# Patient Record
Sex: Female | Born: 1960 | Race: White | Hispanic: No | Marital: Married | Smoking: Never smoker
Health system: Southern US, Community
[De-identification: ages and names within clinical notes are randomized; demographics above are authoritative.]

## PROBLEM LIST (undated history)

## (undated) DIAGNOSIS — E785 Hyperlipidemia, unspecified: Secondary | ICD-10-CM

## (undated) DIAGNOSIS — F329 Major depressive disorder, single episode, unspecified: Secondary | ICD-10-CM

## (undated) DIAGNOSIS — F419 Anxiety disorder, unspecified: Secondary | ICD-10-CM

## (undated) DIAGNOSIS — F32A Depression, unspecified: Secondary | ICD-10-CM

## (undated) DIAGNOSIS — K219 Gastro-esophageal reflux disease without esophagitis: Secondary | ICD-10-CM

## (undated) DIAGNOSIS — G43909 Migraine, unspecified, not intractable, without status migrainosus: Secondary | ICD-10-CM

## (undated) DIAGNOSIS — Z8719 Personal history of other diseases of the digestive system: Secondary | ICD-10-CM

## (undated) DIAGNOSIS — H9190 Unspecified hearing loss, unspecified ear: Secondary | ICD-10-CM

## (undated) DIAGNOSIS — K8044 Calculus of bile duct with chronic cholecystitis without obstruction: Secondary | ICD-10-CM

## (undated) HISTORY — PX: NASAL SEPTUM SURGERY: SHX37

## (undated) HISTORY — PX: TONSILLECTOMY: SHX5217

## (undated) HISTORY — DX: Migraine, unspecified, not intractable, without status migrainosus: G43.909

## (undated) HISTORY — DX: Hyperlipidemia, unspecified: E78.5

## (undated) HISTORY — DX: Major depressive disorder, single episode, unspecified: F32.9

## (undated) HISTORY — PX: ABDOMINAL HYSTERECTOMY: SHX81

## (undated) HISTORY — PX: TUBAL LIGATION: SHX77

## (undated) HISTORY — DX: Depression, unspecified: F32.A

## (undated) HISTORY — DX: Anxiety disorder, unspecified: F41.9

## (undated) HISTORY — PX: CYSTECTOMY: SUR359

---

## 1999-01-06 ENCOUNTER — Encounter: Payer: Self-pay | Admitting: Orthopedic Surgery

## 1999-01-06 ENCOUNTER — Encounter: Admission: RE | Admit: 1999-01-06 | Discharge: 1999-01-06 | Payer: Self-pay | Admitting: Orthopedic Surgery

## 2007-05-20 ENCOUNTER — Other Ambulatory Visit: Admission: RE | Admit: 2007-05-20 | Discharge: 2007-05-20 | Payer: Self-pay | Admitting: Family Medicine

## 2009-02-21 ENCOUNTER — Other Ambulatory Visit: Admission: RE | Admit: 2009-02-21 | Discharge: 2009-02-21 | Payer: Self-pay | Admitting: Family Medicine

## 2009-10-02 ENCOUNTER — Encounter: Admission: RE | Admit: 2009-10-02 | Discharge: 2009-10-02 | Payer: Self-pay | Admitting: Family Medicine

## 2010-02-09 ENCOUNTER — Encounter: Payer: Self-pay | Admitting: Family Medicine

## 2011-08-11 ENCOUNTER — Telehealth (INDEPENDENT_AMBULATORY_CARE_PROVIDER_SITE_OTHER): Payer: Self-pay

## 2011-08-11 NOTE — Telephone Encounter (Signed)
LMOM for pt to call me b/c trying to move pt's appt up earlier to see Dr Michaell Cowing this week. Advised to ask for Milagros Middendorf.

## 2011-08-13 ENCOUNTER — Ambulatory Visit (INDEPENDENT_AMBULATORY_CARE_PROVIDER_SITE_OTHER): Payer: Self-pay | Admitting: Surgery

## 2011-08-13 ENCOUNTER — Encounter (INDEPENDENT_AMBULATORY_CARE_PROVIDER_SITE_OTHER): Payer: Self-pay | Admitting: Surgery

## 2011-08-13 VITALS — BP 116/68 | HR 76 | Temp 98.2°F | Resp 14 | Ht 59.5 in | Wt 112.2 lb

## 2011-08-13 DIAGNOSIS — F419 Anxiety disorder, unspecified: Secondary | ICD-10-CM | POA: Insufficient documentation

## 2011-08-13 DIAGNOSIS — R1012 Left upper quadrant pain: Secondary | ICD-10-CM

## 2011-08-13 DIAGNOSIS — R109 Unspecified abdominal pain: Secondary | ICD-10-CM

## 2011-08-13 DIAGNOSIS — R112 Nausea with vomiting, unspecified: Secondary | ICD-10-CM | POA: Insufficient documentation

## 2011-08-13 DIAGNOSIS — R140 Abdominal distension (gaseous): Secondary | ICD-10-CM

## 2011-08-13 DIAGNOSIS — K8020 Calculus of gallbladder without cholecystitis without obstruction: Secondary | ICD-10-CM

## 2011-08-13 DIAGNOSIS — K8044 Calculus of bile duct with chronic cholecystitis without obstruction: Secondary | ICD-10-CM | POA: Insufficient documentation

## 2011-08-13 DIAGNOSIS — G43909 Migraine, unspecified, not intractable, without status migrainosus: Secondary | ICD-10-CM | POA: Insufficient documentation

## 2011-08-13 HISTORY — DX: Calculus of bile duct with chronic cholecystitis without obstruction: K80.44

## 2011-08-13 NOTE — Progress Notes (Addendum)
Subjective:     Patient ID: Cheryl Fields, female   DOB: [birth year], 51 y.o.   MRN: [id number]  HPI  Cheryl Fields  Feb 02, 1960 [id number]  Patient Care Team: Johny Blamer, MD as PCP - General (Family Medicine)  This patient is a 51 y.o.female who presents today for surgical evaluation at the request of Dr. Tiburcio Pea  Reason for evaluation: Epigastric and chest pain postprandial.  Question of biliary etiology.  Pleasant female with depression and anxiety and other issues.  She's had three episodes of severe epigastric pain.  It will radiate to her chest and shoulder.  She's had nausea associated with it.  She feels like if she could only throughout she feel better.  It started a few months ago.  She's had three bad attacks.  One was after a hamburger, one was after some activities.  The pain radiates to the left chest and the left shoulder.  She went to the ER one time.  EKG was negative.  She does not smoke.  It's not associated with activity.  No pressure.  No sweating.  She does struggle with some mild heartburn and reflux from time to time.  Does not take anything for that.  This is not like that.  Claims she can walk about 30 minutes without much difficulty.  She does struggle with chronic constipation with a bowel movement about every 2-3 days.  Never had a colonoscopy.  Patient Active Problem List  Diagnosis  . Anxiety  . Migraines  . Gallstones  . Abdominal pain, acute, LUQ & epigastric  . Nausea & vomiting    Past Medical History  Diagnosis Date  . Hyperlipidemia   . Migraines   . Depression   . Hypertension   . Anxiety     Past Surgical History  Procedure Date  . Nasal septum surgery   . Cystectomy   . Tubal ligation   . Abdominal hysterectomy     partial  . Tonsillectomy     History   Social History  . Marital Status: Married    Spouse Name: N/A    Number of Children: N/A  . Years of Education: N/A   Occupational History  . Not on file.    Social History Main Topics  . Smoking status: Former Games developer  . Smokeless tobacco: Not on file  . Alcohol Use: No  . Drug Use: No  . Sexually Active:    Other Topics Concern  . Not on file   Social History Narrative  . No narrative on file    Family History  Problem Relation Age of Onset  . Cancer Mother     breast  . Cancer Paternal Grandmother     colon  . Cancer Paternal Grandfather     olon    Current Outpatient Prescriptions  Medication Sig Dispense Refill  . ALPRAZolam (XANAX) 0.5 MG tablet Take 0.5 mg by mouth at bedtime as needed.      Marland Kitchen buPROPion (WELLBUTRIN XL) 150 MG 24 hr tablet Take 150 mg by mouth daily.      Marland Kitchen buPROPion (WELLBUTRIN XL) 300 MG 24 hr tablet Take 300 mg by mouth daily.      . carisoprodol (SOMA) 350 MG tablet Take 350 mg by mouth 4 (four) times daily as needed.      . Cholecalciferol (VITAMIN D) 400 UNITS capsule Take 400 Units by mouth daily.      Marland Kitchen estradiol (ESTRACE) 2 MG tablet Take 2 mg by  mouth daily.      Marland Kitchen HYDROcodone-acetaminophen (VICODIN) 5-500 MG per tablet Take 1 tablet by mouth every 6 (six) hours as needed.      . pravastatin (PRAVACHOL) 40 MG tablet Take 40 mg by mouth daily.      . promethazine (PHENERGAN) 25 MG tablet Take 25 mg by mouth every 6 (six) hours as needed.      . rizatriptan (MAXALT) 10 MG tablet Take 10 mg by mouth as needed. May repeat in 2 hours if needed      . topiramate (TOPAMAX) 25 MG capsule Take 25 mg by mouth daily.         No Known Allergies  BP 116/68  Pulse 76  Temp 98.2 F (36.8 C) (Temporal)  Resp 14  Ht 4' 11.5" (1.511 m)  Wt 112 lb 3.2 oz (50.894 kg)  BMI 22.28 kg/m2  No results found. '  Review of Systems  Constitutional: Negative for fever, chills, diaphoresis, appetite change and fatigue.  HENT: Positive for hearing loss and congestion. Negative for ear pain, sore throat, trouble swallowing, neck pain and ear discharge.   Eyes: Negative for photophobia, discharge and visual  disturbance.  Respiratory: Negative for cough, choking, chest tightness and shortness of breath.   Cardiovascular: Negative for chest pain and palpitations.       Patient walks 30 minutes for about 1 miles without difficulty.  No exertional chest/neck/shoulder/arm pain.   Gastrointestinal: Positive for nausea, vomiting, abdominal pain and constipation. Negative for diarrhea, anal bleeding and rectal pain.       No personal of GI/colon cancer, inflammatory bowel disease, irritable bowel syndrome, allergy such as Celiac Sprue, dietary/dairy problems, colitis, ulcers nor gastritis.    No recent sick contacts/gastroenteritis.  No travel outside the country.  No changes in diet.    Genitourinary: Negative for dysuria, frequency and difficulty urinating.  Musculoskeletal: Negative for myalgias and gait problem.  Skin: Negative for color change, pallor and rash.  Neurological: Negative for dizziness, speech difficulty, weakness and numbness.  Hematological: Negative for adenopathy.  Psychiatric/Behavioral: Negative for confusion and agitation. The patient is not nervous/anxious.        Objective:   Physical Exam  Constitutional: She is oriented to person, place, and time. She appears well-developed and well-nourished. No distress.  HENT:  Head: Normocephalic.  Mouth/Throat: Oropharynx is clear and moist. No oropharyngeal exudate.  Eyes: Conjunctivae and EOM are normal. Pupils are equal, round, and reactive to light. No scleral icterus.  Neck: Normal range of motion. Neck supple. No tracheal deviation present.  Cardiovascular: Normal rate, regular rhythm and intact distal pulses.   Pulmonary/Chest: Effort normal and breath sounds normal. No respiratory distress. She exhibits no tenderness.  Abdominal: Soft. She exhibits no distension and no mass. There is tenderness in the right upper quadrant, epigastric area and left upper quadrant. There is no rigidity, no guarding, no CVA tenderness, no  tenderness at McBurney's point and negative Murphy's sign. No hernia. Hernia confirmed negative in the right inguinal area and confirmed negative in the left inguinal area.  Genitourinary: No vaginal discharge found.  Musculoskeletal: Normal range of motion. She exhibits no tenderness.  Lymphadenopathy:    She has no cervical adenopathy.       Right: No inguinal adenopathy present.       Left: No inguinal adenopathy present.  Neurological: She is alert and oriented to person, place, and time. No cranial nerve deficit. She exhibits normal muscle tone. Coordination normal.  Skin: Skin  is warm and dry. No rash noted. She is not diaphoretic. No erythema.  Psychiatric: She has a normal mood and affect. Her behavior is normal. Judgment and thought content normal.       Assessment:     Postprandial left abdominal and left shoulder pain.  Suspicious for biliary colic but location atypical.    Plan:     I laid out several options for her:  I think I would get a gastric emptying study to make sure she does not have delayed gastric emptying which could be mimicking some of her symptoms.  Trial of omeprazole twice a day x3 weeks to see if that resolves her symptoms.  She claims she's had heartburn and reflux and this is different, But I see no harm in doing a two to three-week trial to see if that helps.  I suggested to her that she see gastroenterology first.  Have them evaluate her to see if her pain is of a different etiology.  She already needs a screening colonoscopy since she is over 50 and does have a history of colon cancer.  She wanted to hold off on that.  Another option is to get a cardiac evaluation given the Left-sidedpain.  EKG was negative when she went emergency room.  Story is postprandial not associated with activity.  She has pretty good physical tolerance.  She is not interested in pursuing I cardiology evaluation at this time.  I will defer to Dr. Tiburcio Pea on that.  Could consider a  stress test just to be sure  If her gastric emptying study is negative and she has no relief with the omeprazole, then consider cholecystectomy.  Gastroenterology evaluation afterwards for a screening colonoscopy.  If she has abnormal tests, consider gastroenterology evaluation first.  I did discuss the procedure with her:  The anatomy & physiology of hepatobiliary & pancreatic function was discussed.  The pathophysiology of gallbladder dysfunction was discussed.  Natural history risks without surgery was discussed.   I feel the risks of no intervention will lead to serious problems that outweigh the operative risks; therefore, I recommended cholecystectomy to remove the pathology.  I explained laparoscopic techniques with possible need for an open approach.  Probable cholangiogram to evaluate the bilary tract was explained as well.   Risks such as bleeding, infection, abscess, leak, injury to other organs, need for further treatment, heart attack, death, and other risks were discussed.  I noted a good likelihood this will help address the problem.  Possibility that this will not correct all abdominal symptoms was explained.  Goals of post-operative recovery were discussed as well.  We will work to minimize complications.  An educational handout further explaining the pathology and treatment options was given as well.  Questions were answered.  The patient expresses understanding & leaning towards surgery.

## 2011-08-13 NOTE — Patient Instructions (Addendum)
See the Handout(s) we gave you.  Obtain a gastric emptying study to see if your stomach is the problem  Consider surgery.  Please call our office at [phone] if you wish to schedule surgery or if you have further questions / concerns.    Cholelithiasis Cholelithiasis (also called gallstones) is a form of gallbladder disease where gallstones form in your gallbladder. The gallbladder is a non-essential organ that stores bile made in the liver, which helps digest fats. Gallstones begin as small crystals and slowly grow into stones. Gallstone pain occurs when the gallbladder spasms, and a gallstone is blocking the duct. Pain can also occur when a stone passes out of the duct.  Women are more likely to develop gallstones than men. Other factors that increase the risk of gallbladder disease are:  Having multiple pregnancies. Physicians sometimes advise removing diseased gallbladders before future pregnancies.   Obesity.   Diets heavy in fried foods and fat.   Increasing age (older than 26).   Prolonged use of medications containing female hormones.   Diabetes mellitus.   Rapid weight loss.   Family history of gallstones (heredity).  SYMPTOMS  Feeling sick to your stomach (nauseous).   Abdominal pain.   Yellowing of the skin (jaundice).   Sudden pain. It may persist from several minutes to several hours.   Worsening pain with deep breathing or when jarred.   Fever.   Tenderness to the touch.  In some cases, when gallstones do not move into the bile duct, people have no pain or symptoms. These are called "silent" gallstones. TREATMENT In severe cases, emergency surgery may be required. HOME CARE INSTRUCTIONS   Only take over-the-counter or prescription medicines for pain, discomfort, or fever as directed by your caregiver.   Follow a low-fat diet until seen again. Fat causes the gallbladder to contract, which can result in pain.   Follow up as instructed. Attacks are  almost always recurrent and surgery is usually required for permanent treatment.  SEEK IMMEDIATE MEDICAL CARE IF:   Your pain increases and is not controlled by medications.   You have an oral temperature above 102 F (38.9 C), not controlled by medication.   You develop nausea and vomiting.  MAKE SURE YOU:   Understand these instructions.   Will watch your condition.   Will get help right away if you are not doing well or get worse.  Document Released: 01/01/2005 Document Revised: 12/25/2010 Document Reviewed: 03/06/2010 [facility] Patient Information 2012 [place], [place].  You might have: Gastroesophageal Reflux Disease, Adult Gastroesophageal reflux disease (GERD) happens when acid from your stomach flows up into the esophagus. When acid comes in contact with the esophagus, the acid causes soreness (inflammation) in the esophagus. Over time, GERD may create small holes (ulcers) in the lining of the esophagus. CAUSES   Increased body weight. This puts pressure on the stomach, making acid rise from the stomach into the esophagus.   Smoking. This increases acid production in the stomach.   Drinking alcohol. This causes decreased pressure in the lower esophageal sphincter (valve or ring of muscle between the esophagus and stomach), allowing acid from the stomach into the esophagus.   Late evening meals and a full stomach. This increases pressure and acid production in the stomach.   A malformed lower esophageal sphincter.  Sometimes, no cause is found. SYMPTOMS   Burning pain in the lower part of the mid-chest behind the breastbone and in the mid-stomach area. This may occur twice a week  or more often.   Trouble swallowing.   Sore throat.   Dry cough.   Asthma-like symptoms including chest tightness, shortness of breath, or wheezing.  DIAGNOSIS  Your caregiver may be able to diagnose GERD based on your symptoms. In some cases, X-rays and other tests may be done to check for  complications or to check the condition of your stomach and esophagus. TREATMENT  Your caregiver may recommend over-the-counter or prescription medicines to help decrease acid production. Ask your caregiver before starting or adding any new medicines.  HOME CARE INSTRUCTIONS   Change the factors that you can control. Ask your caregiver for guidance concerning weight loss, quitting smoking, and alcohol consumption.   Avoid foods and drinks that make your symptoms worse, such as:   Caffeine or alcoholic drinks.   Chocolate.   Peppermint or mint flavorings.   Garlic and onions.   Spicy foods.   Citrus fruits, such as oranges, lemons, or limes.   Tomato-based foods such as sauce, chili, salsa, and pizza.   Fried and fatty foods.   Avoid lying down for the 3 hours prior to your bedtime or prior to taking a nap.   Eat small, frequent meals instead of large meals.   Wear loose-fitting clothing. Do not wear anything tight around your waist that causes pressure on your stomach.   Raise the head of your bed 6 to 8 inches with wood blocks to help you sleep. Extra pillows will not help.   Only take over-the-counter or prescription medicines for pain, discomfort, or fever as directed by your caregiver.   Do not take aspirin, ibuprofen, or other nonsteroidal anti-inflammatory drugs (NSAIDs).  SEEK IMMEDIATE MEDICAL CARE IF:   You have pain in your arms, neck, jaw, teeth, or back.   Your pain increases or changes in intensity or duration.   You develop nausea, vomiting, or sweating (diaphoresis).   You develop shortness of breath, or you faint.   Your vomit is green, yellow, black, or looks like coffee grounds or blood.   Your stool is red, bloody, or black.  These symptoms could be signs of other problems, such as heart disease, gastric bleeding, or esophageal bleeding. MAKE SURE YOU:   Understand these instructions.   Will watch your condition.   Will get help right away if  you are not doing well or get worse.  Document Released: 10/15/2004 Document Revised: 12/25/2010 Document Reviewed: 07/25/2010 [facility] Patient Information 2012 [place], [place].  GETTING TO GOOD BOWEL HEALTH. Irregular bowel habits such as constipation and diarrhea can lead to many problems over time.  Having one soft bowel movement a day is the most important way to prevent further problems.  The anorectal canal is designed to handle stretching and feces to safely manage our ability to get rid of solid waste (feces, poop, stool) out of our body.  BUT, hard constipated stools can act like ripping concrete bricks and diarrhea can be a burning fire to this very sensitive area of our body, causing inflamed hemorrhoids, anal fissures, increasing risk is perirectal abscesses, abdominal pain/bloating, an making irritable bowel worse.     The goal: ONE SOFT BOWEL MOVEMENT A DAY!  To have soft, regular bowel movements:    Drink at least 8 tall glasses of water a day.     Take plenty of fiber.  Fiber is the undigested part of plant food that passes into the colon, acting s "natures broom" to encourage bowel motility and movement.  Fiber can  absorb and hold large amounts of water. This results in a larger, bulkier stool, which is soft and easier to pass. Work gradually over several weeks up to 6 servings a day of fiber (25g a day even more if needed) in the form of: o Vegetables -- Root (potatoes, carrots, turnips), leafy green (lettuce, salad greens, celery, spinach), or cooked high residue (cabbage, broccoli, etc) o Fruit -- Fresh (unpeeled skin & pulp), Dried (prunes, apricots, cherries, etc ),  or stewed ( applesauce)  o Whole grain breads, pasta, etc (whole wheat)  o Bran cereals    Bulking Agents -- This type of water-retaining fiber generally is easily obtained each day by one of the following:  o Psyllium bran -- The psyllium plant is remarkable because its ground seeds can retain so much water. This  product is available as Metamucil, Konsyl, Effersyllium, Per Diem Fiber, or the less expensive generic preparation in drug and health food stores. Although labeled a laxative, it really is not a laxative.  o Methylcellulose -- This is another fiber derived from wood which also retains water. It is available as Citrucel. o Polyethylene Glycol - and "artificial" fiber commonly called Miralax or Glycolax.  It is helpful for people with gassy or bloated feelings with regular fiber o Flax Seed - a less gassy fiber than psyllium   No reading or other relaxing activity while on the toilet. If bowel movements take longer than 5 minutes, you are too constipated   AVOID CONSTIPATION.  High fiber and water intake usually takes care of this.  Sometimes a laxative is needed to stimulate more frequent bowel movements, but    Laxatives are not a good long-term solution as it can wear the colon out. o Osmotics (Milk of Magnesia, Fleets phosphosoda, Magnesium citrate, MiraLax, GoLytely) are safer than  o Stimulants (Senokot, Castor Oil, Dulcolax, Ex Lax)    o Do not take laxatives for more than 7days in a row.    IF SEVERELY CONSTIPATED, try a Bowel Retraining Program: o Do not use laxatives.  o Eat a diet high in roughage, such as bran cereals and leafy vegetables.  o Drink six (6) ounces of prune or apricot juice each morning.  o Eat two (2) large servings of stewed fruit each day.  o Take one (1) heaping tablespoon of a psyllium-based bulking agent twice a day. Use sugar-free sweetener when possible to avoid excessive calories.  o Eat a normal breakfast.  o Set aside 15 minutes after breakfast to sit on the toilet, but do not strain to have a bowel movement.  o If you do not have a bowel movement by the third day, use an enema and repeat the above steps.    Controlling diarrhea o Switch to liquids and simpler foods for a few days to avoid stressing your intestines further. o Avoid dairy products (especially  milk & ice cream) for a short time.  The intestines often can lose the ability to digest lactose when stressed. o Avoid foods that cause gassiness or bloating.  Typical foods include beans and other legumes, cabbage, broccoli, and dairy foods.  Every person has some sensitivity to other foods, so listen to our body and avoid those foods that trigger problems for you. o Adding fiber (Citrucel, Metamucil, psyllium, Miralax) gradually can help thicken stools by absorbing excess fluid and retrain the intestines to act more normally.  Slowly increase the dose over a few weeks.  Too much fiber too soon can backfire  and cause cramping & bloating. o Probiotics (such as active yogurt, Align, etc) may help repopulate the intestines and colon with normal bacteria and calm down a sensitive digestive tract.  Most studies show it to be of mild help, though, and such products can be costly. o Medicines:   Bismuth subsalicylate (ex. Kayopectate, Pepto Bismol) every 30 minutes for up to 6 doses can help control diarrhea.  Avoid if pregnant.   Loperamide (Immodium) can slow down diarrhea.  Start with two tablets (4mg  total) first and then try one tablet every 6 hours.  Avoid if you are having fevers or severe pain.  If you are not better or start feeling worse, stop all medicines and call your doctor for advice o Call your doctor if you are getting worse or not better.  Sometimes further testing (cultures, endoscopy, X-ray studies, bloodwork, etc) may be needed to help diagnose and treat the cause of the diarrhea. o

## 2011-08-14 ENCOUNTER — Telehealth (INDEPENDENT_AMBULATORY_CARE_PROVIDER_SITE_OTHER): Payer: Self-pay

## 2011-08-14 NOTE — Telephone Encounter (Signed)
Returned call. The pt was wanting to know if she didn't get her gastric emptying study done would Dr Michaell Cowing still do her gallbladder surgery. I advised pt that Dr Michaell Cowing would not do the surgery without the test being completed. The pt will go ahead with the test.

## 2011-08-21 ENCOUNTER — Encounter (HOSPITAL_COMMUNITY)
Admission: RE | Admit: 2011-08-21 | Discharge: 2011-08-21 | Disposition: A | Discharge status: Home / Self Care | Payer: Self-pay | Source: Ambulatory Visit | Attending: Surgery | Admitting: Surgery

## 2011-08-21 DIAGNOSIS — R140 Abdominal distension (gaseous): Secondary | ICD-10-CM

## 2011-08-21 DIAGNOSIS — R141 Gas pain: Secondary | ICD-10-CM | POA: Insufficient documentation

## 2011-08-21 DIAGNOSIS — R1012 Left upper quadrant pain: Secondary | ICD-10-CM | POA: Insufficient documentation

## 2011-08-21 DIAGNOSIS — R142 Eructation: Secondary | ICD-10-CM | POA: Insufficient documentation

## 2011-08-21 DIAGNOSIS — K5900 Constipation, unspecified: Secondary | ICD-10-CM | POA: Insufficient documentation

## 2011-08-21 MED ORDER — TECHNETIUM TC 99M SULFUR COLLOID
2.2000 | Freq: Once | INTRAVENOUS | Status: AC | PRN
  Administered 2011-08-21: 2.2 via INTRAVENOUS

## 2011-08-26 ENCOUNTER — Encounter (HOSPITAL_COMMUNITY): Payer: Self-pay | Admitting: Respiratory Therapy

## 2011-08-26 ENCOUNTER — Ambulatory Visit (INDEPENDENT_AMBULATORY_CARE_PROVIDER_SITE_OTHER): Payer: Self-pay | Admitting: Surgery

## 2011-08-26 ENCOUNTER — Telehealth (INDEPENDENT_AMBULATORY_CARE_PROVIDER_SITE_OTHER): Payer: Self-pay

## 2011-08-26 ENCOUNTER — Encounter (INDEPENDENT_AMBULATORY_CARE_PROVIDER_SITE_OTHER): Payer: Self-pay

## 2011-08-26 NOTE — Telephone Encounter (Signed)
Called pt to notify her that her gastric emptying study was normal per Dr Michaell Cowing and he advised pt to continue his recommendation of the Omeprazole with the fiber. The pt has been doing the Omeprazole with the fiber but can't tell any differences in her symptoms. The pt is having regular BM's now with the fiber she is taking everday. The pt is already scheduled for her lap chole surgery on 09/10/11 and she wishes to proceed with the surgery date. I advised pt that I did send Dr Tiburcio Pea a medical clearance request along with Dr Gordy Savers last office note. The pt understands and wishes to proceed with surgery.

## 2011-08-28 ENCOUNTER — Telehealth (INDEPENDENT_AMBULATORY_CARE_PROVIDER_SITE_OTHER): Payer: Self-pay | Admitting: General Surgery

## 2011-08-28 NOTE — Telephone Encounter (Signed)
Message copied by Liliana Cline on Fri Aug 28, 2011 12:04 PM ------      Message from: Cathi Roan      Created: Fri Aug 28, 2011  9:04 AM      Regarding: Question before Surgery       [phone] Has a question for Dr. Michaell Cowing before her surgery

## 2011-08-28 NOTE — Telephone Encounter (Signed)
Spoke with patient. She is still unsure whether she would like to proceed with surgery. She wanted to discuss with Dr Michaell Cowing whether he thought these symptoms were really coming from her gallbladder. I reviewed Dr Gordy Savers last office note and explained to patient there is no way to know for sure. Her symptoms are suspicious for her gallbladder but she does have pain on the left side which is atypical. Her gastric emptying study was normal and the omeprazole doesn't seem to be making a difference. I again offered the patient a consult with gastroenterology or cardiology. She declined. She will think about the options over the weekend and call us on Monday to let us know if she would like to cancel surgery.

## 2011-09-02 ENCOUNTER — Other Ambulatory Visit (INDEPENDENT_AMBULATORY_CARE_PROVIDER_SITE_OTHER): Payer: Self-pay | Admitting: Surgery

## 2011-09-02 ENCOUNTER — Encounter (HOSPITAL_COMMUNITY): Payer: Self-pay | Admitting: *Deleted

## 2011-09-03 ENCOUNTER — Ambulatory Visit (HOSPITAL_COMMUNITY)
Admission: RE | Admit: 2011-09-03 | Discharge: 2011-09-04 | Disposition: A | Discharge status: Home / Self Care | Payer: Self-pay | Source: Ambulatory Visit | Attending: Surgery | Admitting: Surgery

## 2011-09-03 ENCOUNTER — Encounter (HOSPITAL_COMMUNITY): Payer: Self-pay | Admitting: Anesthesiology

## 2011-09-03 ENCOUNTER — Ambulatory Visit (HOSPITAL_COMMUNITY): Payer: Self-pay | Admitting: Anesthesiology

## 2011-09-03 ENCOUNTER — Ambulatory Visit (HOSPITAL_COMMUNITY): Payer: Self-pay

## 2011-09-03 ENCOUNTER — Encounter (HOSPITAL_COMMUNITY)
Admission: RE | Disposition: A | Discharge status: Home / Self Care | Payer: Self-pay | Source: Ambulatory Visit | Attending: Surgery

## 2011-09-03 ENCOUNTER — Encounter (HOSPITAL_COMMUNITY): Payer: Self-pay | Admitting: *Deleted

## 2011-09-03 DIAGNOSIS — J3489 Other specified disorders of nose and nasal sinuses: Secondary | ICD-10-CM | POA: Insufficient documentation

## 2011-09-03 DIAGNOSIS — K8064 Calculus of gallbladder and bile duct with chronic cholecystitis without obstruction: Secondary | ICD-10-CM | POA: Insufficient documentation

## 2011-09-03 DIAGNOSIS — F419 Anxiety disorder, unspecified: Secondary | ICD-10-CM | POA: Diagnosis present

## 2011-09-03 DIAGNOSIS — R109 Unspecified abdominal pain: Secondary | ICD-10-CM | POA: Diagnosis present

## 2011-09-03 DIAGNOSIS — K8060 Calculus of gallbladder and bile duct with cholecystitis, unspecified, without obstruction: Secondary | ICD-10-CM | POA: Insufficient documentation

## 2011-09-03 DIAGNOSIS — R112 Nausea with vomiting, unspecified: Secondary | ICD-10-CM | POA: Insufficient documentation

## 2011-09-03 DIAGNOSIS — E785 Hyperlipidemia, unspecified: Secondary | ICD-10-CM | POA: Insufficient documentation

## 2011-09-03 DIAGNOSIS — Z90710 Acquired absence of both cervix and uterus: Secondary | ICD-10-CM | POA: Insufficient documentation

## 2011-09-03 DIAGNOSIS — R1013 Epigastric pain: Secondary | ICD-10-CM | POA: Insufficient documentation

## 2011-09-03 DIAGNOSIS — F411 Generalized anxiety disorder: Secondary | ICD-10-CM | POA: Insufficient documentation

## 2011-09-03 DIAGNOSIS — K8010 Calculus of gallbladder with chronic cholecystitis without obstruction: Secondary | ICD-10-CM

## 2011-09-03 DIAGNOSIS — R1012 Left upper quadrant pain: Secondary | ICD-10-CM | POA: Insufficient documentation

## 2011-09-03 DIAGNOSIS — Z79899 Other long term (current) drug therapy: Secondary | ICD-10-CM | POA: Insufficient documentation

## 2011-09-03 DIAGNOSIS — G43909 Migraine, unspecified, not intractable, without status migrainosus: Secondary | ICD-10-CM | POA: Insufficient documentation

## 2011-09-03 DIAGNOSIS — K8044 Calculus of bile duct with chronic cholecystitis without obstruction: Secondary | ICD-10-CM

## 2011-09-03 DIAGNOSIS — K5900 Constipation, unspecified: Secondary | ICD-10-CM | POA: Insufficient documentation

## 2011-09-03 DIAGNOSIS — K219 Gastro-esophageal reflux disease without esophagitis: Secondary | ICD-10-CM | POA: Insufficient documentation

## 2011-09-03 HISTORY — PX: CHOLECYSTECTOMY: SHX55

## 2011-09-03 HISTORY — DX: Gastro-esophageal reflux disease without esophagitis: K21.9

## 2011-09-03 HISTORY — PX: INTRAOPERATIVE CHOLANGIOGRAM: SHX5230

## 2011-09-03 LAB — CBC
HCT: 36.5 % (ref 36.0–46.0)
MCHC: 33.7 g/dL (ref 30.0–36.0)
MCV: 89.2 fL (ref 78.0–100.0)
RDW: 13.1 % (ref 11.5–15.5)

## 2011-09-03 LAB — SURGICAL PCR SCREEN: Staphylococcus aureus: NEGATIVE

## 2011-09-03 SURGERY — LAPAROSCOPIC CHOLECYSTECTOMY SINGLE SITE
Anesthesia: General | Wound class: Clean Contaminated

## 2011-09-03 MED ORDER — LACTATED RINGERS IV SOLN
INTRAVENOUS | Status: DC
  Administered 2011-09-03: 19:00:00 via INTRAVENOUS

## 2011-09-03 MED ORDER — ACETAMINOPHEN 10 MG/ML IV SOLN
INTRAVENOUS | Status: AC
  Filled 2011-09-03: qty 100

## 2011-09-03 MED ORDER — 0.9 % SODIUM CHLORIDE (POUR BTL) OPTIME
TOPICAL | Status: DC | PRN
  Administered 2011-09-03: 1000 mL

## 2011-09-03 MED ORDER — OXYCODONE HCL 5 MG PO TABS: 5.0000 mg | ORAL_TABLET | Freq: Once | ORAL | Status: DC | PRN

## 2011-09-03 MED ORDER — BUPIVACAINE-EPINEPHRINE 0.25% -1:200000 IJ SOLN
INTRAMUSCULAR | Status: AC
  Filled 2011-09-03: qty 1

## 2011-09-03 MED ORDER — NAPROXEN 500 MG PO TABS
500.0000 mg | ORAL_TABLET | Freq: Two times a day (BID) | ORAL | Status: DC | PRN
  Filled 2011-09-03: qty 1

## 2011-09-03 MED ORDER — SUMATRIPTAN SUCCINATE 50 MG PO TABS
50.0000 mg | ORAL_TABLET | ORAL | Status: DC | PRN
  Filled 2011-09-03: qty 1

## 2011-09-03 MED ORDER — ONDANSETRON HCL 4 MG/2ML IJ SOLN
INTRAMUSCULAR | Status: DC | PRN
  Administered 2011-09-03: 4 mg via INTRAVENOUS

## 2011-09-03 MED ORDER — RINGERS IRRIGATION IR SOLN
Status: DC | PRN
  Administered 2011-09-03: 3000 mL

## 2011-09-03 MED ORDER — MUPIROCIN 2 % EX OINT
TOPICAL_OINTMENT | Freq: Two times a day (BID) | CUTANEOUS | Status: DC
  Filled 2011-09-03: qty 22

## 2011-09-03 MED ORDER — CARISOPRODOL 350 MG PO TABS
350.0000 mg | ORAL_TABLET | Freq: Three times a day (TID) | ORAL | Status: DC | PRN
  Administered 2011-09-04: 350 mg via ORAL
  Filled 2011-09-03: qty 1

## 2011-09-03 MED ORDER — ATROPINE SULFATE 0.4 MG/ML IJ SOLN
INTRAMUSCULAR | Status: DC | PRN
Start: 2011-09-03 — End: 2011-09-03
  Administered 2011-09-03: 0.4 mg via INTRAVENOUS

## 2011-09-03 MED ORDER — IOHEXOL 300 MG/ML  SOLN
INTRAMUSCULAR | Status: AC
  Filled 2011-09-03: qty 1

## 2011-09-03 MED ORDER — ACETAMINOPHEN 10 MG/ML IV SOLN: 1000.0000 mg | Freq: Once | INTRAVENOUS | Status: DC | PRN

## 2011-09-03 MED ORDER — BUPROPION HCL ER (XL) 300 MG PO TB24
300.0000 mg | ORAL_TABLET | Freq: Every day | ORAL | Status: DC
  Administered 2011-09-04: 300 mg via ORAL
  Filled 2011-09-03: qty 1

## 2011-09-03 MED ORDER — CHLORHEXIDINE GLUCONATE 4 % EX LIQD
1.0000 "application " | Freq: Once | CUTANEOUS | Status: DC
  Filled 2011-09-03: qty 15

## 2011-09-03 MED ORDER — HYDROMORPHONE HCL PF 1 MG/ML IJ SOLN
INTRAMUSCULAR | Status: AC
  Filled 2011-09-03: qty 1

## 2011-09-03 MED ORDER — SODIUM CHLORIDE 0.9 % IV SOLN
1.5000 g | Freq: Once | INTRAVENOUS | Status: AC
  Administered 2011-09-04: 1.5 g via INTRAVENOUS
  Filled 2011-09-03: qty 1.5

## 2011-09-03 MED ORDER — MIDAZOLAM HCL 5 MG/5ML IJ SOLN
INTRAMUSCULAR | Status: DC | PRN
  Administered 2011-09-03: 2 mg via INTRAVENOUS

## 2011-09-03 MED ORDER — FENTANYL CITRATE 0.05 MG/ML IJ SOLN
INTRAMUSCULAR | Status: DC | PRN
  Administered 2011-09-03 (×3): 50 ug via INTRAVENOUS
  Administered 2011-09-03: 100 ug via INTRAVENOUS

## 2011-09-03 MED ORDER — NEOSTIGMINE METHYLSULFATE 1 MG/ML IJ SOLN
INTRAMUSCULAR | Status: DC | PRN
  Administered 2011-09-03: 3.5 mg via INTRAVENOUS

## 2011-09-03 MED ORDER — ACETAMINOPHEN 10 MG/ML IV SOLN
INTRAVENOUS | Status: DC | PRN
  Administered 2011-09-03: 1000 mg via INTRAVENOUS

## 2011-09-03 MED ORDER — MEPERIDINE HCL 50 MG/ML IJ SOLN: 6.2500 mg | INTRAMUSCULAR | Status: DC | PRN

## 2011-09-03 MED ORDER — MENTHOL 3 MG MT LOZG
1.0000 | LOZENGE | OROMUCOSAL | Status: DC | PRN
  Administered 2011-09-03: 3 mg via ORAL
  Filled 2011-09-03 (×2): qty 9

## 2011-09-03 MED ORDER — OXYCODONE HCL 5 MG/5ML PO SOLN
5.0000 mg | Freq: Once | ORAL | Status: DC | PRN
  Filled 2011-09-03: qty 5

## 2011-09-03 MED ORDER — SIMVASTATIN 20 MG PO TABS
20.0000 mg | ORAL_TABLET | Freq: Every day | ORAL | Status: DC
  Filled 2011-09-03 (×2): qty 1

## 2011-09-03 MED ORDER — MAGIC MOUTHWASH
15.0000 mL | Freq: Four times a day (QID) | ORAL | Status: DC | PRN
  Administered 2011-09-03: 15 mL via ORAL
  Filled 2011-09-03 (×2): qty 15

## 2011-09-03 MED ORDER — BUPROPION HCL ER (XL) 150 MG PO TB24
150.0000 mg | ORAL_TABLET | Freq: Every day | ORAL | Status: DC
  Administered 2011-09-04: 150 mg via ORAL
  Filled 2011-09-03: qty 1

## 2011-09-03 MED ORDER — HYDROMORPHONE HCL PF 1 MG/ML IJ SOLN
0.2500 mg | INTRAMUSCULAR | Status: DC | PRN
  Administered 2011-09-03 (×3): 0.5 mg via INTRAVENOUS

## 2011-09-03 MED ORDER — HYDROCODONE-ACETAMINOPHEN 5-325 MG PO TABS
1.0000 | ORAL_TABLET | ORAL | Status: DC | PRN
  Administered 2011-09-03: 1 via ORAL
  Filled 2011-09-03: qty 2
  Filled 2011-09-03: qty 1

## 2011-09-03 MED ORDER — HYDROCODONE-ACETAMINOPHEN 5-500 MG PO TABS: 1.0000 | ORAL_TABLET | ORAL | Status: DC | PRN

## 2011-09-03 MED ORDER — KETOROLAC TROMETHAMINE 30 MG/ML IJ SOLN
INTRAMUSCULAR | Status: DC | PRN
  Administered 2011-09-03: 30 mg via INTRAVENOUS

## 2011-09-03 MED ORDER — LACTATED RINGERS IV SOLN
INTRAVENOUS | Status: DC
  Administered 2011-09-03: 1000 mL via INTRAVENOUS
  Administered 2011-09-03: 11:00:00 via INTRAVENOUS

## 2011-09-03 MED ORDER — LIDOCAINE HCL (CARDIAC) 20 MG/ML IV SOLN
INTRAVENOUS | Status: DC | PRN
  Administered 2011-09-03: 50 mg via INTRAVENOUS

## 2011-09-03 MED ORDER — TOPIRAMATE 25 MG PO TABS
25.0000 mg | ORAL_TABLET | Freq: Every day | ORAL | Status: DC
  Filled 2011-09-03 (×2): qty 1

## 2011-09-03 MED ORDER — ASPIRIN-ACETAMINOPHEN-CAFFEINE 250-250-65 MG PO TABS
1.0000 | ORAL_TABLET | Freq: Three times a day (TID) | ORAL | Status: DC | PRN
  Filled 2011-09-03: qty 1

## 2011-09-03 MED ORDER — BUPIVACAINE-EPINEPHRINE 0.25% -1:200000 IJ SOLN
INTRAMUSCULAR | Status: DC | PRN
  Administered 2011-09-03: 50 mL

## 2011-09-03 MED ORDER — CHLORHEXIDINE GLUCONATE 4 % EX LIQD
1.0000 | Freq: Once | CUTANEOUS | Status: DC
Start: 2011-09-03 — End: 2011-09-03
  Filled 2011-09-03: qty 15

## 2011-09-03 MED ORDER — PROMETHAZINE HCL 25 MG/ML IJ SOLN: 6.2500 mg | INTRAMUSCULAR | Status: DC | PRN

## 2011-09-03 MED ORDER — LACTATED RINGERS IV BOLUS (SEPSIS): 1000.0000 mL | Freq: Three times a day (TID) | INTRAVENOUS | Status: DC | PRN

## 2011-09-03 MED ORDER — PROMETHAZINE HCL 25 MG/ML IJ SOLN: 12.5000 mg | Freq: Four times a day (QID) | INTRAMUSCULAR | Status: DC | PRN

## 2011-09-03 MED ORDER — HYDROMORPHONE HCL PF 1 MG/ML IJ SOLN
0.5000 mg | INTRAMUSCULAR | Status: DC | PRN
  Administered 2011-09-03 – 2011-09-04 (×3): 1 mg via INTRAVENOUS
  Filled 2011-09-03 (×3): qty 1

## 2011-09-03 MED ORDER — DIPHENHYDRAMINE HCL 50 MG/ML IJ SOLN: 12.5000 mg | Freq: Four times a day (QID) | INTRAMUSCULAR | Status: DC | PRN

## 2011-09-03 MED ORDER — PROPOFOL 10 MG/ML IV BOLUS
INTRAVENOUS | Status: DC | PRN
  Administered 2011-09-03: 150 mg via INTRAVENOUS

## 2011-09-03 MED ORDER — STERILE WATER FOR IRRIGATION IR SOLN
Status: DC | PRN
  Administered 2011-09-03: 1000 mL

## 2011-09-03 MED ORDER — EPHEDRINE SULFATE 50 MG/ML IJ SOLN
INTRAMUSCULAR | Status: DC | PRN
  Administered 2011-09-03: 10 mg via INTRAVENOUS

## 2011-09-03 MED ORDER — ALUM & MAG HYDROXIDE-SIMETH 200-200-20 MG/5ML PO SUSP
30.0000 mL | Freq: Four times a day (QID) | ORAL | Status: DC | PRN
  Filled 2011-09-03: qty 30

## 2011-09-03 MED ORDER — SODIUM CHLORIDE 0.9 % IJ SOLN
INTRAMUSCULAR | Status: DC | PRN
  Administered 2011-09-03: 11:00:00

## 2011-09-03 MED ORDER — ALPRAZOLAM 0.5 MG PO TABS: 0.5000 mg | ORAL_TABLET | Freq: Three times a day (TID) | ORAL | Status: DC | PRN

## 2011-09-03 MED ORDER — ROCURONIUM BROMIDE 100 MG/10ML IV SOLN
INTRAVENOUS | Status: DC | PRN
  Administered 2011-09-03: 40 mg via INTRAVENOUS

## 2011-09-03 MED ORDER — ONDANSETRON HCL 4 MG/2ML IJ SOLN
4.0000 mg | Freq: Four times a day (QID) | INTRAMUSCULAR | Status: DC | PRN
  Administered 2011-09-03: 4 mg via INTRAVENOUS
  Administered 2011-09-03: 8 mg via INTRAVENOUS
  Filled 2011-09-03: qty 2
  Filled 2011-09-03: qty 4

## 2011-09-03 MED ORDER — MAGNESIUM HYDROXIDE 400 MG/5ML PO SUSP
30.0000 mL | Freq: Two times a day (BID) | ORAL | Status: DC | PRN
  Filled 2011-09-03: qty 30

## 2011-09-03 MED ORDER — GLYCOPYRROLATE 0.2 MG/ML IJ SOLN
INTRAMUSCULAR | Status: DC | PRN
  Administered 2011-09-03: 0.4 mg via INTRAVENOUS

## 2011-09-03 MED ORDER — ESTRADIOL 2 MG PO TABS
2.0000 mg | ORAL_TABLET | Freq: Every day | ORAL | Status: DC
  Administered 2011-09-04: 2 mg via ORAL
  Filled 2011-09-03 (×2): qty 1

## 2011-09-03 SURGICAL SUPPLY — 43 items
APPLIER CLIP ROT 10 11.4 M/L (STAPLE) ×3
APR CLP MED LRG 11.4X10 (STAPLE) ×2
BAG SPEC RTRVL LRG 6X4 10 (ENDOMECHANICALS)
CABLE HIGH FREQUENCY MONO STRZ (ELECTRODE) ×3 IMPLANT
CANISTER SUCTION 2500CC (MISCELLANEOUS) ×3 IMPLANT
CLIP APPLIE ROT 10 11.4 M/L (STAPLE) ×2 IMPLANT
CLOTH BEACON ORANGE TIMEOUT ST (SAFETY) ×3 IMPLANT
COVER MAYO STAND STRL (DRAPES) ×3 IMPLANT
DECANTER SPIKE VIAL GLASS SM (MISCELLANEOUS) ×3 IMPLANT
DRAPE C-ARM 42X72 X-RAY (DRAPES) ×3 IMPLANT
DRAPE LAPAROSCOPIC ABDOMINAL (DRAPES) ×3 IMPLANT
DRAPE UTILITY XL STRL (DRAPES) ×1 IMPLANT
DRAPE WARM FLUID 44X44 (DRAPE) ×4 IMPLANT
DRSG TEGADERM 4X4.75 (GAUZE/BANDAGES/DRESSINGS) ×5 IMPLANT
ELECT REM PT RETURN 9FT ADLT (ELECTROSURGICAL) ×3
ELECTRODE REM PT RTRN 9FT ADLT (ELECTROSURGICAL) ×2 IMPLANT
ENDOLOOP SUT PDS II  0 18 (SUTURE) ×1
ENDOLOOP SUT PDS II 0 18 (SUTURE) IMPLANT
GLOVE BIOGEL PI IND STRL 7.0 (GLOVE) ×2 IMPLANT
GLOVE BIOGEL PI INDICATOR 7.0 (GLOVE) ×1
GLOVE ECLIPSE 8.0 STRL XLNG CF (GLOVE) ×3 IMPLANT
GLOVE INDICATOR 8.0 STRL GRN (GLOVE) ×3 IMPLANT
GOWN STRL NON-REIN LRG LVL3 (GOWN DISPOSABLE) ×3 IMPLANT
GOWN STRL REIN XL XLG (GOWN DISPOSABLE) ×6 IMPLANT
IV LACTATED RINGER IRRG 3000ML (IV SOLUTION) ×3
IV LR IRRIG 3000ML ARTHROMATIC (IV SOLUTION) ×2 IMPLANT
KIT BASIN OR (CUSTOM PROCEDURE TRAY) ×3 IMPLANT
NS IRRIG 1000ML POUR BTL (IV SOLUTION) ×3 IMPLANT
POUCH SPECIMEN RETRIEVAL 10MM (ENDOMECHANICALS) IMPLANT
SCISSORS LAP 5X35 DISP (ENDOMECHANICALS) ×3 IMPLANT
SET CHOLANGIOGRAPH MIX (MISCELLANEOUS) ×1 IMPLANT
SET IRRIG TUBING LAPAROSCOPIC (IRRIGATION / IRRIGATOR) ×3 IMPLANT
SLEEVE Z-THREAD 5X100MM (TROCAR) ×3 IMPLANT
SOLUTION ANTI FOG 6CC (MISCELLANEOUS) ×3 IMPLANT
STRIP CLOSURE SKIN 1/2X4 (GAUZE/BANDAGES/DRESSINGS) IMPLANT
SUT MNCRL AB 4-0 PS2 18 (SUTURE) ×3 IMPLANT
SUT VICRYL 0 TIES 12 18 (SUTURE) IMPLANT
TOWEL OR 17X26 10 PK STRL BLUE (TOWEL DISPOSABLE) ×6 IMPLANT
TRAY LAP CHOLE (CUSTOM PROCEDURE TRAY) ×3 IMPLANT
TROCAR 5M 150ML BLDLS (TROCAR) ×3 IMPLANT
TROCAR Z-THREAD FIOS 11X100 BL (TROCAR) IMPLANT
TROCAR Z-THREAD FIOS 5X100MM (TROCAR) ×3 IMPLANT
TUBING INSUFFLATION 10FT LAP (TUBING) ×3 IMPLANT

## 2011-09-03 NOTE — Interval H&P Note (Signed)
History and Physical Interval Note:  09/03/2011 9:25 AM  Higinio Plan  has presented today for surgery, with the diagnosis of chronic cholelithiasis  The various methods of treatment have been discussed with the patient and family. After consideration of risks, benefits and other options for treatment, the patient has consented to  Procedure(s) (LRB): LAPAROSCOPIC CHOLECYSTECTOMY SINGLE PORT (N/A) as a surgical intervention .  The patient's history has been reviewed, patient examined, no change in status, stable for surgery.  I have reviewed the patient's chart and labs.   The patient has not had any improvements on proton pump inhibitors.  The patient is not had improvements with her constipation now under better control.  The patient's gastric emptying stormy study was normal without any reproduction of symptoms.  The patient is not interested in seeing a gastroenterologist this time.  Therefore, I have offered cholecystectomy.  I did caution there is a chance this is not solve the problem but given the more thorough workup, I'm running out of the other possible etiologies in the differential diagnosis.  She understands the risks and wishes to proceed   Questions were answered to the patient's satisfaction.     Prabhnoor Ellenberger C.

## 2011-09-03 NOTE — Consult Note (Signed)
Referring Provider: Dr. Michaell Cowing Primary Care Physician:  Johny Blamer, MD Primary Gastroenterologist:  Gentry Fitz  Reason for Consultation:  CBD stones  HPI: Cheryl Fields is a 51 y.o. female who has had intermittent abdominal pains since May and had an elective cholecystectomy today with an intraoperative cholangiogram that showed multiple small filling defects in the distal CBD concerning for stones without obstruction noted on IOC. Pt has just come from the PACU and is nauseous and having abdominal pain. Nurse at bedside.  Past Medical History  Diagnosis Date  . Hyperlipidemia   . Migraines   . Depression   . Anxiety   . GERD (gastroesophageal reflux disease)     Past Surgical History  Procedure Date  . Nasal septum surgery   . Cystectomy   . Tubal ligation   . Abdominal hysterectomy     partial  . Tonsillectomy     Prior to Admission medications   Medication Sig Start Date End Date Taking? Authorizing Provider  ALPRAZolam Prudy Feeler) 0.5 MG tablet Take 0.5 mg by mouth as needed. For anxiety   Yes Historical Provider, MD  Aspirin-Caffeine 500-32.5 MG TABS Take 1 tablet by mouth every 8 (eight) hours as needed. Pain   Yes Historical Provider, MD  buPROPion (WELLBUTRIN XL) 150 MG 24 hr tablet Take 150 mg by mouth daily. Total of 450 mg   Yes Historical Provider, MD  buPROPion (WELLBUTRIN XL) 300 MG 24 hr tablet Take 300 mg by mouth daily. Total of 450 mg   Yes Historical Provider, MD  carisoprodol (SOMA) 350 MG tablet Take 350 mg by mouth 3 (three) times daily as needed. For neck pain   Yes Historical Provider, MD  Cholecalciferol (VITAMIN D) 400 UNITS capsule Take 800 Units by mouth daily.    Yes Historical Provider, MD  estradiol (ESTRACE) 2 MG tablet Take 2 mg by mouth daily.   Yes Historical Provider, MD  pravastatin (PRAVACHOL) 40 MG tablet Take 40 mg by mouth daily.   Yes Historical Provider, MD  promethazine (PHENERGAN) 25 MG tablet Take 25 mg by mouth every 6 (six)  hours as needed. For nausea   Yes Historical Provider, MD  rizatriptan (MAXALT) 10 MG tablet Take 10 mg by mouth as needed. May repeat in 2 hours if needed   Yes Historical Provider, MD  HYDROcodone-acetaminophen (VICODIN) 5-500 MG per tablet Take 1-2 tablets by mouth every 4 (four) hours as needed for pain. For pain 09/03/11   [name], MD  topiramate (TOPAMAX) 25 MG capsule Take 25 mg by mouth daily. Pt takes at bedtime    Historical Provider, MD    Scheduled Meds:   . buPROPion  150 mg Oral Daily  . buPROPion  300 mg Oral Daily  . estradiol  2 mg Oral Daily  . HYDROmorphone      . HYDROmorphone      . simvastatin  20 mg Oral q1800  . topiramate  25 mg Oral Daily  . DISCONTD: chlorhexidine  1 application Topical Once  . DISCONTD: chlorhexidine  1 application Topical Once  . DISCONTD: mupirocin ointment   Nasal BID   Continuous Infusions:   . lactated ringers    . DISCONTD: lactated ringers     PRN Meds:.ALPRAZolam, alum & mag hydroxide-simeth, aspirin-acetaminophen-caffeine, carisoprodol, diphenhydrAMINE, HYDROcodone-acetaminophen, HYDROmorphone (DILAUDID) injection, lactated ringers, magic mouthwash, magnesium hydroxide, naproxen, ondansetron (ZOFRAN) IV, promethazine, SUMAtriptan, DISCONTD: 0.9 % irrigation (POUR BTL), DISCONTD: acetaminophen, DISCONTD: bupivacaine-EPINEPHrine, DISCONTD:  HYDROmorphone (DILAUDID) injection DISCONTD: meperidine (DEMEROL) injection,  DISCONTD: Omnipaque 300 mg/mL (10 ml)in 0.9 % normal saline (10 mL), DISCONTD: oxyCODONE, DISCONTD: oxyCODONE, DISCONTD: promethazine, DISCONTD: ringers, DISCONTD: sterile water for irrigation  Allergies as of 09/02/2011  . (No Known Allergies)    Family History  Problem Relation Age of Onset  . Cancer Mother     breast  . Cancer Paternal Grandmother     colon  . Cancer Paternal Grandfather     olon    History   Social History  . Marital Status: Married    Spouse Name: N/A    Number of Children: N/A    . Years of Education: N/A   Occupational History  . Not on file.   Social History Main Topics  . Smoking status: Former Games developer  . Smokeless tobacco: Not on file  . Alcohol Use: No  . Drug Use: No  . Sexually Active:    Other Topics Concern  . Not on file   Social History Narrative  . No narrative on file    Review of Systems: Negative except as stated above Physical Exam: Vital signs: Filed Vitals:   09/03/11 1345  BP: 125/66  Pulse: 74  Temp: 97.8  Resp: 18     General:   Lethargic, Well-developed, well-nourished, pleasant, mild acute distress Lungs:  Clear throughout to auscultation.   No wheezes, crackles, or rhonchi. No acute distress. Heart:  Regular rate and rhythm; no murmurs, clicks, rubs,  or gallops. Abdomen: diffusely tender, soft, nondistended, positive bowel sounds  Rectal:  Deferred  GI:  Lab Results:  Basename 09/03/11 0715  WBC 6.8  HGB 12.3  HCT 36.5  PLT 227   BMET No results found for this basename: NA:3,K:3,CL:3,CO2:3,GLUCOSE:3,BUN:3,CREATININE:3,CALCIUM:3 in the last 72 hours LFT No results found for this basename: PROT,ALBUMIN,AST,ALT,ALKPHOS,BILITOT,BILIDIR,IBILI in the last 72 hours PT/INR No results found for this basename: LABPROT:2,INR:2 in the last 72 hours   Studies/Results: Dg Cholangiogram Operative  09/03/2011  *RADIOLOGY REPORT*  Clinical Data:   Abdominal pain  INTRAOPERATIVE CHOLANGIOGRAM  Technique:  Cholangiographic images from the C-arm fluoroscopic device were submitted for interpretation post-operatively.  Please see the procedural report for the amount of contrast and the fluoroscopy time utilized.  Comparison:  None.  Findings:  Contrast fills the biliary tree and duodenum compatible with patency.  Several filling defects are present in the distal common bile duct just proximal to the ampulla worrisome for common bile duct stones.  No evidence of bile leak.  IMPRESSION: Patent biliary tree.  Distal common bile duct  stones are noted.  GI consultation noted in the operative report.  Original Report Authenticated By: Donavan Burnet, M.D.    Impression/Plan: 51yo WF s/p lap chole who has a +IOC concerning for CBD stones. Pt needs ERCP for stone extraction. Risks/benefits of ERCP discussed with patient and she agrees to proceed. ERCP planned for tomorrow morning by Dr. Evette Cristal.    LOS: 0 days   Ednah Hammock C.  09/03/2011, 3:11 PM

## 2011-09-03 NOTE — Progress Notes (Signed)
Patient felt like she needed to urinate. Sat up on side of bed. Felt dizzy. Did not ambulate to BR. Pt sat on bedside commode x 20 minutes. Unable to void

## 2011-09-03 NOTE — Anesthesia Postprocedure Evaluation (Signed)
Anesthesia Post Note  Patient: Cheryl Fields  Procedure(s) Performed: Procedure(s) (LRB): LAPAROSCOPIC CHOLECYSTECTOMY SINGLE PORT (N/A) INTRAOPERATIVE CHOLANGIOGRAM ()  Anesthesia type: General  Patient location: PACU  Post pain: Pain level controlled  Post assessment: Post-op Vital signs reviewed  Last Vitals: BP 142/70  Pulse 72  Temp 36.4 C (Oral)  Resp 19  Ht 4\' 11"  (1.499 m)  Wt 112 lb 2 oz (50.86 kg)  BMI 22.65 kg/m2  SpO2 100%  Post vital signs: Reviewed  Level of consciousness: sedated  Complications: No apparent anesthesia complications

## 2011-09-03 NOTE — Transfer of Care (Signed)
Immediate Anesthesia Transfer of Care Note  Patient: Cheryl Fields  Procedure(s) Performed: Procedure(s) (LRB): LAPAROSCOPIC CHOLECYSTECTOMY SINGLE PORT (N/A) INTRAOPERATIVE CHOLANGIOGRAM ()  Patient Location: PACU  Anesthesia Type: General  Level of Consciousness: awake and sedated  Airway & Oxygen Therapy: Patient Spontanous Breathing and Patient connected to face mask oxygen  Post-op Assessment: Report given to PACU RN and Post -op Vital signs reviewed and stable  Post vital signs: Reviewed and stable  Complications: No apparent anesthesia complications

## 2011-09-03 NOTE — Op Note (Addendum)
09/03/2011  11:03 AM  PATIENT:  Cheryl Fields  51 y.o. female  Patient Care Team: Johny Blamer, MD as PCP - General (Family Medicine) [name], MD as Consulting Physician (General Surgery) Shirley Friar, MD as Consulting Physician (Gastroenterology)  PRE-OPERATIVE DIAGNOSIS:    chronic cholecystitis  POST-OPERATIVE DIAGNOSIS:    chronic cholecystitis Choledocolithiasis  PROCEDURE:  Procedure(s): LAPAROSCOPIC CHOLECYSTECTOMY SINGLE PORT INTRAOPERATIVE CHOLANGIOGRAM  SURGEON:  Surgeon(s): [name], MD  ASSISTANT: none   ANESTHESIA:   local and general  EBL:  Total I/O In: 1000 [I.V.:1000] Out: -   Delay start of Pharmacological VTE agent (>24hrs) due to surgical blood loss or risk of bleeding:  no  DRAINS: none   SPECIMEN:  Source of Specimen:  Gallbladder  DISPOSITION OF SPECIMEN:  PATHOLOGY  COUNTS:  YES  Fields OF CARE: Discharge to home after PACU, possibly overnight observation  PATIENT DISPOSITION:  PACU - hemodynamically stable.  INDICATION: Pleasant woman with intermittent postprandial pain.  Tended to be epigastric and left-sided.  Workup otherwise negative.  No improvement on PPIs.  Gastric emptying study normal.  Cardiac workup negative in ED after an attack.  Constipation controlled but still having nausea and pain.  Based on the persistent symptoms and differential diagnosis more ruled out, patient wished to proceed with cholecystectomy as possible treatment:  The anatomy & physiology of hepatobiliary & pancreatic function was discussed.  The pathophysiology of gallbladder dysfunction was discussed.  Natural history risks without surgery was discussed.   I feel the risks of no intervention will lead to serious problems that outweigh the operative risks; therefore, I recommended cholecystectomy to remove the pathology.  I explained laparoscopic techniques with possible need for an open approach.  Probable cholangiogram to evaluate  the bilary tract was explained as well.    Risks such as bleeding, infection, abscess, leak, injury to other organs, need for further treatment, heart attack, death, and other risks were discussed.  I noted a good likelihood this will help address the problem.  Possibility that this will not correct all abdominal symptoms was explained.  Goals of post-operative recovery were discussed as well.  We will work to minimize complications.  An educational handout further explaining the pathology and treatment options was given as well.  Questions were answered.  The patient expresses understanding & wishes to proceed with surgery.  OR FINDINGS: The patient had a few mesial colon and omental adhesions to the gallbladder.  Dilated and boggy gallbladder.  Suspicious for chronic cholecystitis.  Had numerous black tiny stones clogged in the cystic duct.  IOC reveals numerous distal common bile duct stones.  Not completely obstructing.  DESCRIPTION:   The patient was identified & brought in the operating room. The patient was positioned supine with arms tucked. SCDs were active during the entire case. The patient underwent general anesthesia without any difficulty.  The abdomen was prepped and draped in a sterile fashion. A Surgical Timeout confirmed our Fields.  I made a transverse curvilinear incision through the superior umbilical fold.  I placed a 5mm long port through the supraumbilical fascia using a modified Hassan cutdown technique. I began carbon dioxide insufflation. Camera inspection revealed no injury. There were no adhesions to the anterior abdominal wall supraumbilically.  I proceeded to continue with single site technique. I placed a #5 port in left upper aspect of the wound. I placed a 5 mm atraumatic grasper in the right inferior aspect of the wound.  I turned attention to  the right upper quadrant.  I freed off adhesions to the gallbladder using ultrasonic and focused blunt dissection  The  gallbladder fundus was elevated cephalad. I freed the peritoneal coverings between the gallbladder and the liver on the posteriolateral and anteriomedial walls. I alternated between Harmonic & blunt Maryland dissection to help get a good critical view of the cystic artery and cystic duct. I did further dissection to free a few centimeters of the  gallbladder off the liver bed to get a good critical view of the infundibulum and cystic duct. I mobilized the cystic artery; and, after getting a good 360 view, ligated the cystic artery using the Harmonic ultrasonic dissection. I skeletonized the cystic duct.  I placed a clip on the infundibulum. I did a partial cystic duct-otomy and ensured patency. I placed a 5 [place] cholangiocatheter through a puncture site at the right subcostal ridge of the abdominal wall and directed it into the cystic duct.  Initially I transected into a duct valve & the catheter would not advance.  I did a ductotomy a little closer to the common bile duct but still had 2 cm distance & the catheter advanced smoothly  We ran a cholangiogram with dilute radio-opaque contrast and continuous fluoroscopy. Contrast flowed from a side branch consistent with cystic duct cannulization. Contrast flowed up the common hepatic duct into the right and left intrahepatic chains out to secondary radicals. Contrast flowed down the common bile duct across the normal ampulla into the duodenum, But there were numerous small filling defects consistent with the common bile duct stones.  They were not completely obstructing.  I removed the cholangiocatheter. I placed clips on the cystic duct x3.   I completed cystic duct transection. I further ligated the cystic duct stump with a 0 PDS Endoloop,  I freed the gallbladder from its remaining attachments to the liver. I ensured hemostasis on the gallbladder fossa of the liver and elsewhere. I inspected the rest of the abdomen & detected no injury nor bleeding  elsewhere.  I removed the gallbladder out the supraumbilical fascia. I closed the fascia transversely using 0 Vicryl interrupted stitches. A closed the skin using 4-0 monocryl stitch.  Sterile dressing was applied. The patient was extubated & arrived in the PACU in stable condition..  I had discussed postoperative care with the patient in the holding area.  I am about to locate the patient's family and discuss operative findings and postoperative goals / instructions.  Instructions are written in the chart as well.  I called [facility].  Dr. Bosie Clos will see the patient later today in anticipation of probable ERCP by Dr. Evette Cristal to evacuate the distant common bile duct stones in the morning.  We will place her in observation overnight and get liver function tests in the morning

## 2011-09-03 NOTE — Anesthesia Preprocedure Evaluation (Addendum)
Anesthesia Evaluation  Patient identified by MRN, date of birth, ID band Patient awake    Reviewed: Allergy & Precautions, H&P , NPO status , Patient's Chart, lab work & pertinent test results  Airway Mallampati: II TM Distance: >3 FB Neck ROM: Full    Dental  (+) Teeth Intact and Dental Advisory Given   Pulmonary neg pulmonary ROS,  breath sounds clear to auscultation  Pulmonary exam normal       Cardiovascular Exercise Tolerance: Good Rhythm:Regular Rate:Normal     Neuro/Psych  Headaches, PSYCHIATRIC DISORDERS Anxiety Depression    GI/Hepatic Neg liver ROS, GERD-  Controlled,  Endo/Other  negative endocrine ROS  Renal/GU negative Renal ROS     Musculoskeletal negative musculoskeletal ROS (+)   Abdominal (+) - obese,   Peds  Hematology negative hematology ROS (+)   Anesthesia Other Findings   Reproductive/Obstetrics                          Anesthesia Physical Anesthesia Plan  ASA: II  Anesthesia Plan: General   Post-op Pain Management:    Induction: Intravenous  Airway Management Planned: Oral ETT  Additional Equipment:   Intra-op Plan:   Post-operative Plan: Extubation in OR  Informed Consent: I have reviewed the patients History and Physical, chart, labs and discussed the procedure including the risks, benefits and alternatives for the proposed anesthesia with the patient or authorized representative who has indicated his/her understanding and acceptance.   Dental advisory given  Plan Discussed with: CRNA and Surgeon  Anesthesia Plan Comments:        Anesthesia Quick Evaluation

## 2011-09-03 NOTE — H&P (View-Only) (Signed)
Subjective:     Patient ID: Cheryl Fields, female   DOB: 06/20/1960, 51 y.o.   MRN: [id number]  HPI  Cheryl Fields  12/30/1960 [id number]  Patient Care Team: William Harris, MD as PCP - General (Family Medicine)  This patient is a 51 y.o.female who presents today for surgical evaluation at the request of Dr. Harris  Reason for evaluation: Epigastric and chest pain postprandial.  Question of biliary etiology.  Pleasant female with depression and anxiety and other issues.  She's had three episodes of severe epigastric pain.  It will radiate to her chest and shoulder.  She's had nausea associated with it.  She feels like if she could only throughout she feel better.  It started a few months ago.  She's had three bad attacks.  One was after a hamburger, one was after some activities.  The pain radiates to the left chest and the left shoulder.  She went to the ER one time.  EKG was negative.  She does not smoke.  It's not associated with activity.  No pressure.  No sweating.  She does struggle with some mild heartburn and reflux from time to time.  Does not take anything for that.  This is not like that.  Claims she can walk about 30 minutes without much difficulty.  She does struggle with chronic constipation with a bowel movement about every 2-3 days.  Never had a colonoscopy.  Patient Active Problem List  Diagnosis  . Anxiety  . Migraines  . Gallstones  . Abdominal pain, acute, LUQ & epigastric  . Nausea & vomiting    Past Medical History  Diagnosis Date  . Hyperlipidemia   . Migraines   . Depression   . Hypertension   . Anxiety     Past Surgical History  Procedure Date  . Nasal septum surgery   . Cystectomy   . Tubal ligation   . Abdominal hysterectomy     partial  . Tonsillectomy     History   Social History  . Marital Status: Married    Spouse Name: N/A    Number of Children: N/A  . Years of Education: N/A   Occupational History  . Not on file.    Social History Main Topics  . Smoking status: Former Smoker  . Smokeless tobacco: Not on file  . Alcohol Use: No  . Drug Use: No  . Sexually Active:    Other Topics Concern  . Not on file   Social History Narrative  . No narrative on file    Family History  Problem Relation Age of Onset  . Cancer Mother     breast  . Cancer Paternal Grandmother     colon  . Cancer Paternal Grandfather     olon    Current Outpatient Prescriptions  Medication Sig Dispense Refill  . ALPRAZolam (XANAX) 0.5 MG tablet Take 0.5 mg by mouth at bedtime as needed.      . buPROPion (WELLBUTRIN XL) 150 MG 24 hr tablet Take 150 mg by mouth daily.      . buPROPion (WELLBUTRIN XL) 300 MG 24 hr tablet Take 300 mg by mouth daily.      . carisoprodol (SOMA) 350 MG tablet Take 350 mg by mouth 4 (four) times daily as needed.      . Cholecalciferol (VITAMIN D) 400 UNITS capsule Take 400 Units by mouth daily.      . estradiol (ESTRACE) 2 MG tablet Take 2 mg by   mouth daily.      . HYDROcodone-acetaminophen (VICODIN) 5-500 MG per tablet Take 1 tablet by mouth every 6 (six) hours as needed.      . pravastatin (PRAVACHOL) 40 MG tablet Take 40 mg by mouth daily.      . promethazine (PHENERGAN) 25 MG tablet Take 25 mg by mouth every 6 (six) hours as needed.      . rizatriptan (MAXALT) 10 MG tablet Take 10 mg by mouth as needed. May repeat in 2 hours if needed      . topiramate (TOPAMAX) 25 MG capsule Take 25 mg by mouth daily.         No Known Allergies  BP 116/68  Pulse 76  Temp 98.2 F (36.8 C) (Temporal)  Resp 14  Ht 4' 11.5" (1.511 m)  Wt 112 lb 3.2 oz (50.894 kg)  BMI 22.28 kg/m2  No results found. '  Review of Systems  Constitutional: Negative for fever, chills, diaphoresis, appetite change and fatigue.  HENT: Positive for hearing loss and congestion. Negative for ear pain, sore throat, trouble swallowing, neck pain and ear discharge.   Eyes: Negative for photophobia, discharge and visual  disturbance.  Respiratory: Negative for cough, choking, chest tightness and shortness of breath.   Cardiovascular: Negative for chest pain and palpitations.       Patient walks 30 minutes for about 1 miles without difficulty.  No exertional chest/neck/shoulder/arm pain.   Gastrointestinal: Positive for nausea, vomiting, abdominal pain and constipation. Negative for diarrhea, anal bleeding and rectal pain.       No personal of GI/colon cancer, inflammatory bowel disease, irritable bowel syndrome, allergy such as Celiac Sprue, dietary/dairy problems, colitis, ulcers nor gastritis.    No recent sick contacts/gastroenteritis.  No travel outside the country.  No changes in diet.    Genitourinary: Negative for dysuria, frequency and difficulty urinating.  Musculoskeletal: Negative for myalgias and gait problem.  Skin: Negative for color change, pallor and rash.  Neurological: Negative for dizziness, speech difficulty, weakness and numbness.  Hematological: Negative for adenopathy.  Psychiatric/Behavioral: Negative for confusion and agitation. The patient is not nervous/anxious.        Objective:   Physical Exam  Constitutional: She is oriented to person, place, and time. She appears well-developed and well-nourished. No distress.  HENT:  Head: Normocephalic.  Mouth/Throat: Oropharynx is clear and moist. No oropharyngeal exudate.  Eyes: Conjunctivae and EOM are normal. Pupils are equal, round, and reactive to light. No scleral icterus.  Neck: Normal range of motion. Neck supple. No tracheal deviation present.  Cardiovascular: Normal rate, regular rhythm and intact distal pulses.   Pulmonary/Chest: Effort normal and breath sounds normal. No respiratory distress. She exhibits no tenderness.  Abdominal: Soft. She exhibits no distension and no mass. There is tenderness in the right upper quadrant, epigastric area and left upper quadrant. There is no rigidity, no guarding, no CVA tenderness, no  tenderness at McBurney's point and negative Murphy's sign. No hernia. Hernia confirmed negative in the right inguinal area and confirmed negative in the left inguinal area.  Genitourinary: No vaginal discharge found.  Musculoskeletal: Normal range of motion. She exhibits no tenderness.  Lymphadenopathy:    She has no cervical adenopathy.       Right: No inguinal adenopathy present.       Left: No inguinal adenopathy present.  Neurological: She is alert and oriented to person, place, and time. No cranial nerve deficit. She exhibits normal muscle tone. Coordination normal.  Skin: Skin   is warm and dry. No rash noted. She is not diaphoretic. No erythema.  Psychiatric: She has a normal mood and affect. Her behavior is normal. Judgment and thought content normal.       Assessment:     Postprandial left abdominal and left shoulder pain.  Suspicious for biliary colic but location atypical.    Plan:     I laid out several options for her:  I think I would get a gastric emptying study to make sure she does not have delayed gastric emptying which could be mimicking some of her symptoms.  Trial of omeprazole twice a day x3 weeks to see if that resolves her symptoms.  She claims she's had heartburn and reflux and this is different, But I see no harm in doing a two to three-week trial to see if that helps.  I suggested to her that she see gastroenterology first.  Have them evaluate her to see if her pain is of a different etiology.  She already needs a screening colonoscopy since she is over 50 and does have a history of colon cancer.  She wanted to hold off on that.  Another option is to get a cardiac evaluation given the Left-sidedpain.  EKG was negative when she went emergency room.  Story is postprandial not associated with activity.  She has pretty good physical tolerance.  She is not interested in pursuing I cardiology evaluation at this time.  I will defer to Dr. Harris on that.  Could consider a  stress test just to be sure  If her gastric emptying study is negative and she has no relief with the omeprazole, then consider cholecystectomy.  Gastroenterology evaluation afterwards for a screening colonoscopy.  If she has abnormal tests, consider gastroenterology evaluation first.  I did discuss the procedure with her:  The anatomy & physiology of hepatobiliary & pancreatic function was discussed.  The pathophysiology of gallbladder dysfunction was discussed.  Natural history risks without surgery was discussed.   I feel the risks of no intervention will lead to serious problems that outweigh the operative risks; therefore, I recommended cholecystectomy to remove the pathology.  I explained laparoscopic techniques with possible need for an open approach.  Probable cholangiogram to evaluate the bilary tract was explained as well.   Risks such as bleeding, infection, abscess, leak, injury to other organs, need for further treatment, heart attack, death, and other risks were discussed.  I noted a good likelihood this will help address the problem.  Possibility that this will not correct all abdominal symptoms was explained.  Goals of post-operative recovery were discussed as well.  We will work to minimize complications.  An educational handout further explaining the pathology and treatment options was given as well.  Questions were answered.  The patient expresses understanding & leaning towards surgery.        

## 2011-09-04 ENCOUNTER — Encounter (HOSPITAL_COMMUNITY)
Admission: RE | Disposition: A | Discharge status: Home / Self Care | Payer: Self-pay | Source: Ambulatory Visit | Attending: Surgery

## 2011-09-04 ENCOUNTER — Ambulatory Visit (HOSPITAL_COMMUNITY): Payer: Self-pay

## 2011-09-04 ENCOUNTER — Other Ambulatory Visit (HOSPITAL_COMMUNITY): Payer: Self-pay

## 2011-09-04 ENCOUNTER — Encounter (HOSPITAL_COMMUNITY): Payer: Self-pay | Admitting: *Deleted

## 2011-09-04 HISTORY — PX: ERCP: SHX5425

## 2011-09-04 LAB — CBC
HCT: 36.6 % (ref 36.0–46.0)
Hemoglobin: 12.4 g/dL (ref 12.0–15.0)
MCV: 90.6 fL (ref 78.0–100.0)
RBC: 4.04 MIL/uL (ref 3.87–5.11)
RDW: 13 % (ref 11.5–15.5)
WBC: 7.8 10*3/uL (ref 4.0–10.5)

## 2011-09-04 LAB — COMPREHENSIVE METABOLIC PANEL
CO2: 26 mEq/L (ref 19–32)
Calcium: 8.8 mg/dL (ref 8.4–10.5)
Creatinine, Ser: 0.76 mg/dL (ref 0.50–1.10)
GFR calc Af Amer: >90 mL/min (ref >90)
GFR calc non Af Amer: >90 mL/min (ref >90)
Glucose, Bld: 95 mg/dL (ref 70–99)
Total Bilirubin: 0.9 mg/dL (ref 0.3–1.2)

## 2011-09-04 SURGERY — ERCP, WITH INTERVENTION IF INDICATED
Anesthesia: Moderate Sedation

## 2011-09-04 MED ORDER — GLYCOPYRROLATE 0.2 MG/ML IJ SOLN
INTRAMUSCULAR | Status: DC | PRN
  Administered 2011-09-04: 0.2 mg via INTRAVENOUS

## 2011-09-04 MED ORDER — SODIUM CHLORIDE 0.9 % IV SOLN: 250.0000 mL | INTRAVENOUS | Status: DC | PRN

## 2011-09-04 MED ORDER — LIP MEDEX EX OINT
TOPICAL_OINTMENT | CUTANEOUS | Status: AC
  Administered 2011-09-04: 11:00:00
  Filled 2011-09-04: qty 7

## 2011-09-04 MED ORDER — MIDAZOLAM HCL 10 MG/2ML IJ SOLN
INTRAMUSCULAR | Status: AC
  Filled 2011-09-04: qty 4

## 2011-09-04 MED ORDER — DIPHENHYDRAMINE HCL 50 MG/ML IJ SOLN
INTRAMUSCULAR | Status: DC | PRN
  Administered 2011-09-04 (×2): 25 mg via INTRAVENOUS

## 2011-09-04 MED ORDER — LIP MEDEX EX OINT
TOPICAL_OINTMENT | CUTANEOUS | Status: DC | PRN
  Filled 2011-09-04: qty 7

## 2011-09-04 MED ORDER — GLYCOPYRROLATE 0.2 MG/ML IJ SOLN
INTRAMUSCULAR | Status: AC
  Filled 2011-09-04: qty 1

## 2011-09-04 MED ORDER — FENTANYL CITRATE 0.05 MG/ML IJ SOLN
INTRAMUSCULAR | Status: DC | PRN
  Administered 2011-09-04 (×4): 25 ug via INTRAVENOUS

## 2011-09-04 MED ORDER — SODIUM CHLORIDE 0.9 % IJ SOLN: 3.0000 mL | INTRAMUSCULAR | Status: DC | PRN

## 2011-09-04 MED ORDER — CHOLECALCIFEROL 10 MCG (400 UNIT) PO TABS
800.0000 [IU] | ORAL_TABLET | Freq: Every day | ORAL | Status: DC
  Administered 2011-09-04: 800 [IU] via ORAL
  Filled 2011-09-04: qty 2

## 2011-09-04 MED ORDER — FENTANYL CITRATE 0.05 MG/ML IJ SOLN
INTRAMUSCULAR | Status: AC
  Filled 2011-09-04: qty 6

## 2011-09-04 MED ORDER — DIPHENHYDRAMINE HCL 50 MG/ML IJ SOLN
INTRAMUSCULAR | Status: AC
  Filled 2011-09-04: qty 1

## 2011-09-04 MED ORDER — SODIUM CHLORIDE 0.9 % IJ SOLN: 3.0000 mL | Freq: Two times a day (BID) | INTRAMUSCULAR | Status: DC

## 2011-09-04 MED ORDER — IOHEXOL 300 MG/ML  SOLN
INTRAMUSCULAR | Status: DC | PRN
  Administered 2011-09-04: 32 mL

## 2011-09-04 MED ORDER — MIDAZOLAM HCL 10 MG/2ML IJ SOLN
INTRAMUSCULAR | Status: DC | PRN
  Administered 2011-09-04: 2.5 mg via INTRAVENOUS
  Administered 2011-09-04 (×2): 2 mg via INTRAVENOUS
  Administered 2011-09-04: 2.5 mg via INTRAVENOUS

## 2011-09-04 MED ORDER — GLUCAGON HCL (RDNA) 1 MG IJ SOLR
INTRAMUSCULAR | Status: AC
  Filled 2011-09-04: qty 2

## 2011-09-04 MED ORDER — BUTAMBEN-TETRACAINE-BENZOCAINE 2-2-14 % EX AERO
INHALATION_SPRAY | CUTANEOUS | Status: DC | PRN
  Administered 2011-09-04: 2 via TOPICAL

## 2011-09-04 NOTE — Op Note (Signed)
[facility] [address] [place], [place]  [address]  ERCP PROCEDURE REPORT  PATIENT:  Cheryl Fields, Cheryl Fields  MR#:  [id number] BIRTHDATE:  1961/01/04  GENDER:  female  ENDOSCOPIST:  Wandalee Ferdinand, MD ASSISTANT:  Tyrone Nine, RN CGRN, Dorisann Frames, Windell Hummingbird, Technician  PROCEDURE DATE:  09/04/2011 PROCEDURE: ERCP with sphincterotomy and balloon sweep of common bile duct. ASA CLASS: 2  INDICATIONS: Status post laparoscopic cholecystectomy with positive intraoperative cholangiogram  MEDICATIONS: Fentanyl 100 mcg IV, Versed 9 mg IV, Benadryl 50 mg IV, Robinul 0.2 mg IV TOPICAL ANESTHETIC: Cetacaine spray  DESCRIPTION OF PROCEDURE:   After the risks benefits and alternatives of the procedure were thoroughly explained, including the risks of bleeding, infection, perforation, and pancreatitis, informed consent was obtained.  The Pentax ERCP [id number] endoscope was introduced through the mouth and advanced to the second portion of the duodenum .  The papilla of Vater was located and looked normal. Selective cannulation of the common bile duct was achieved using a guidewire and papillotome. Contrast was injected into the biliary tree. There appeared to be some haziness of the filling distally in the common duct suggestive of debris or sludge. The rest of the biliary tree was unremarkable. The guidewire was secured and the sphincterotome was properly localized and a sphincterotomy was made without difficulty. Good bile flow was achieved. I saw a couple of flecks of dark material compatible with debris or sludge. The papillotome was then removed and a balloon was advanced over the guidewire up into the proximal duct. The balloon was inflated to 12 mm and the bile duct was swept several times. The 12 mm balloon easily passed through the sphincterotomy  site without resistance. No large stones were seen. Final cholangiogram looked clear.  The pancreatic duct  was not cannulated. <<PROCEDUREIMAGES>>  COMPLICATIONS:  None  ENDOSCOPIC IMPRESSION: CBD stones in the form of small gravel sludge.  RECOMMENDATIONS: Observe clinically post procedure  ______________________________ Wandalee Ferdinand, MD  CC:  n. eSIGNED:   Sam Welborn Keena at 09/04/2011 09:44 AM  De Nurse, [phone]

## 2011-09-04 NOTE — Progress Notes (Signed)
Cheryl Fields [id number] 02/10/1960  CARE TEAM:  PCP: Johny Blamer, MD  Outpatient Care Team: Patient Care Team: Johny Blamer, MD as PCP - General (Family Medicine) [name], MD as Consulting Physician (General Surgery) Shirley Friar, MD as Consulting Physician (Gastroenterology)  Inpatient Treatment Team: Treatment Team: Attending Provider: [name], MD; Consulting Physician: Shirley Friar, MD; Attending Physician: Graylin Shiver, MD  Subjective:  Nausea/Vomiting yesterdaqy - much less nauseated now Walking in room  In bathroom Husband in room  Objective:  Vital signs:  Filed Vitals:   09/03/11 1800 09/03/11 2056 09/04/11 0149 09/04/11 0541  BP: 127/80 129/90 135/80 118/52  Pulse: 60 73 73 72  Temp: 97.6 F (36.4 C) 97.7 F (36.5 C) 97.5 F (36.4 C) 97.9 F (36.6 C)  TempSrc: Oral Oral Oral Oral  Resp: 19 18 16 16   Height:      Weight:      SpO2: 98% 100% 97% 99%    Last BM Date: 09/02/11  Intake/Output   Yesterday:  08/15 0701 - 08/16 0700 In: 2300 [I.V.:2300] Out: 900 [Urine:900] This shift:  Total I/O In: 900 [I.V.:900] Out: 700 [Urine:700]  Bowel function:  Flatus: y  BM: n  Physical Exam:  General: Pt awake/alert/oriented x4 in no acute distress Eyes: PERRL, normal EOM.  Sclera clear.  No icterus Neuro: CN II-XII intact w/o focal sensory/motor deficits. Lymph: No head/neck/groin lymphadenopathy Psych:  No delerium/psychosis/paranoia HENT: Normocephalic, Mucus membranes moist.  No thrush Neck: Supple, No tracheal deviation Chest: No chest wall pain w good excursion CV:  Pulses intact.  Regular rhythm Abdomen: Soft.  Nondistended.  Mildly tender at incisions only.  No incarcerated hernias. Ext:  SCDs BLE.  No mjr edema.  No cyanosis Skin: No petechiae / purpurae  Problem List:  Principal Problem:  *Choledocholithiasis with chronic cholecystitis Active Problems:  Anxiety  Migraines  Abdominal pain,  acute, LUQ & epigastric  Nausea & vomiting   Assessment  Higinio Plan  51 y.o. female  1 Day Post-Op  Procedure(s): LAPAROSCOPIC CHOLECYSTECTOMY SINGLE PORT INTRAOPERATIVE CHOLANGIOGRAM  CBD stones on IOC s/p lap chole yesterday  Plan:  -ERCP this AM -check LFTs/lipase -try PO post procedure -anxiolysis -migraine Tx PRN -VTE prophylaxis- SCDs, etc -mobilize as tolerated to help recovery  -possible D/C later today if meets goals  [name], M.D., F.A.C.S. Gastrointestinal and Minimally Invasive Surgery [facility] [address], [address] [place], [place] [address] [phone] Main / Paging [phone] Voice Mail   09/04/2011  Results:   Labs: Results for orders placed during the hospital encounter of 09/03/11 (from the past 48 hour(s))  SURGICAL PCR SCREEN     Status: Normal   Collection Time   09/03/11  6:57 AM      Component Value Range Comment   MRSA, PCR NEGATIVE  NEGATIVE    Staphylococcus aureus NEGATIVE  NEGATIVE   CBC     Status: Normal   Collection Time   09/03/11  7:15 AM      Component Value Range Comment   WBC 6.8  4.0 - 10.5 K/uL    RBC 4.09  3.87 - 5.11 MIL/uL    Hemoglobin 12.3  12.0 - 15.0 g/dL    HCT 06.3  01.6 - 01.0 %    MCV 89.2  78.0 - 100.0 fL    MCH 30.1  26.0 - 34.0 pg    MCHC 33.7  30.0 - 36.0 g/dL  RDW 13.1  11.5 - 15.5 %    Platelets 227  150 - 400 K/uL   CBC     Status: Normal   Collection Time   09/04/11  3:57 AM      Component Value Range Comment   WBC 7.8  4.0 - 10.5 K/uL    RBC 4.04  3.87 - 5.11 MIL/uL    Hemoglobin 12.4  12.0 - 15.0 g/dL    HCT 16.1  09.6 - 04.5 %    MCV 90.6  78.0 - 100.0 fL    MCH 30.7  26.0 - 34.0 pg    MCHC 33.9  30.0 - 36.0 g/dL    RDW 40.9  81.1 - 91.4 %    Platelets 246  150 - 400 K/uL     Imaging / Studies: Dg Cholangiogram Operative  09/03/2011  *RADIOLOGY REPORT*  Clinical Data:   Abdominal pain  INTRAOPERATIVE CHOLANGIOGRAM  Technique:   Cholangiographic images from the C-arm fluoroscopic device were submitted for interpretation post-operatively.  Please see the procedural report for the amount of contrast and the fluoroscopy time utilized.  Comparison:  None.  Findings:  Contrast fills the biliary tree and duodenum compatible with patency.  Several filling defects are present in the distal common bile duct just proximal to the ampulla worrisome for common bile duct stones.  No evidence of bile leak.  IMPRESSION: Patent biliary tree.  Distal common bile duct stones are noted.  GI consultation noted in the operative report.  Original Report Authenticated By: Donavan Burnet, M.D.    Medications / Allergies: per chart  Antibiotics: Anti-infectives     Start     Dose/Rate Route Frequency Ordered Stop   09/04/11 0730   ampicillin-sulbactam (UNASYN) 1.5 g in sodium chloride 0.9 % 50 mL IVPB        1.5 g 100 mL/hr over 30 Minutes Intravenous  Once 09/03/11 1725

## 2011-09-04 NOTE — H&P (Signed)
Interval H&P for endoscopic procedure (ERCP)  History the patient is a 51 year old female who had a laparoscopic cholecystectomy yesterday. Intraoperative cholangiogram showed filling defects in the distal common bile duct suspicious for gallstones. She is here in the endoscopy department today for ERCP with sphincterotomy and stone extraction.  Past history was reviewed.  Physical  She is alert and oriented and in no acute distress  Heart regular rhythm no murmurs  Lungs clear  Abdomen is soft but does have some generalized discomfort  Impression: Common bile duct stones  Plan: ERCP with sphincterotomy and stone extraction. The procedure was explained in detail to her along with the potential risks of bleeding, infection, perforation, and pancreatitis. She understands and consents to the procedure.

## 2011-09-04 NOTE — Progress Notes (Signed)
Patient discharged home in stable condition. Patient ambulated in hall, tolerated bland diet well with no nausea and vomiting, and pain controlled with PO medication before discharge.  Discharge instructions and teaching given with verbal feedback and understanding

## 2011-09-04 NOTE — Discharge Summary (Signed)
Physician Discharge Summary  Patient ID: Cheryl Fields MRN: [id number] DOB/AGE: 1960/12/17 51 y.o.  Admit date: 09/03/2011 Discharge date: 09/04/2011  Patient Care Team: Johny Blamer, MD as PCP - General (Family Medicine) [name], MD as Consulting Physician (General Surgery) Shirley Friar, MD as Consulting Physician (Gastroenterology)   Admission Diagnoses: Principal Problem:   chronic cholecystitis Active Problems:  Anxiety  Migraines  Abdominal pain, acute, LUQ & epigastric  Nausea & vomiting  Discharge Diagnoses:  Principal Problem:  *Choledocholithiasis with chronic cholecystitis Active Problems:  Anxiety  Migraines  Abdominal pain, acute, LUQ & epigastric  Nausea & vomiting   Discharged Condition: good  Hospital Course: Pleasant woman with postprandial abdominal pain.  Workup otherwise negative.  Known gallstones.  Underwent cholecystectomy.  Cholangiogram noted stones.  Gastrology was consulted.  Patient was left in observation overnight.  She underwent ERCP and common bile duct stones were evacuated.  The patient mobilized and advanced to a solid diet gradually.  Pain was well-controlled and transitioned off IV medications.    By the time of discharge, the patient was walking well the hallways, eating food well, having flatus.  Pain was-controlled on an oral regimen.  Based on meeting DC criteria and recovering well, I felt it was safe for the patient to be discharged home with close followup.  Instructions were discussed in detail.  They are written as well.     Consults: GI  Significant Diagnostic Studies:  Treatments: lap chole w ioc ERCP  Discharge Exam: Blood pressure 130/54, pulse 80, temperature 98.4 F (36.9 C), temperature source Oral, resp. rate 18, height 4\' 11"  (1.499 m), weight 112 lb 2 oz (50.86 kg), SpO2 100.00%.  General: Pt awake/alert/oriented x4 in no major acute distress Eyes: PERRL, normal EOM. Sclera  nonicteric Neuro: CN II-XII intact w/o focal sensory/motor deficits. Lymph: No head/neck/groin lymphadenopathy Psych:  No delerium/psychosis/paranoia HENT: Normocephalic, Mucus membranes moist.  No thrush Neck: Supple, No tracheal deviation Chest: No pain.  Good respiratory excursion. CV:  Pulses intact.  Regular rhythm Abdomen: Soft, Nondistended.  Mildly tender at incision only.  No incarcerated hernias. Ext:  SCDs BLE.  No significant edema.  No cyanosis Skin: No petechiae / purpurae   Disposition: 01-Home or Self Care  Discharge Orders    Future Appointments: Provider: Department: Dept Phone: Center:   09/25/2011 10:00 AM [name], MD [facility] [phone] None     Future Orders Please Complete By Expires   Diet - low sodium heart healthy      Increase activity slowly        Medication List  As of 09/04/2011  8:27 PM   TAKE these medications         ALPRAZolam 0.5 MG tablet   Commonly known as: XANAX   Take 0.5 mg by mouth as needed. For anxiety      Aspirin-Caffeine 500-32.5 MG Tabs   Take 1 tablet by mouth every 8 (eight) hours as needed. Pain      buPROPion 300 MG 24 hr tablet   Commonly known as: WELLBUTRIN XL   Take 300 mg by mouth daily. Total of 450 mg      buPROPion 150 MG 24 hr tablet   Commonly known as: WELLBUTRIN XL   Take 150 mg by mouth daily. Total of 450 mg      carisoprodol 350 MG tablet   Commonly known as: SOMA   Take 350 mg by mouth 3 (three) times daily as needed. For  neck pain      estradiol 2 MG tablet   Commonly known as: ESTRACE   Take 2 mg by mouth daily.      HYDROcodone-acetaminophen 5-500 MG per tablet   Commonly known as: VICODIN   Take 1-2 tablets by mouth every 4 (four) hours as needed for pain. For pain      pravastatin 40 MG tablet   Commonly known as: PRAVACHOL   Take 40 mg by mouth daily.      promethazine 25 MG tablet   Commonly known as: PHENERGAN   Take 25 mg by mouth every 6 (six) hours as needed. For  nausea      rizatriptan 10 MG tablet   Commonly known as: MAXALT   Take 10 mg by mouth as needed. May repeat in 2 hours if needed      topiramate 25 MG capsule   Commonly known as: TOPAMAX   Take 25 mg by mouth daily. Pt takes at bedtime      Vitamin D 400 UNITS capsule   Take 800 Units by mouth daily.           Follow-up Information    Follow up with Brynlyn Dade C., MD. Schedule an appointment as soon as possible for a visit in 2 weeks.   Contact information:   [address] [place] [address] [phone]          Signed: [name]. 09/04/2011, 8:27 PM

## 2011-09-07 ENCOUNTER — Encounter (HOSPITAL_COMMUNITY): Payer: Self-pay | Admitting: Gastroenterology

## 2011-09-09 ENCOUNTER — Telehealth (INDEPENDENT_AMBULATORY_CARE_PROVIDER_SITE_OTHER): Payer: Self-pay

## 2011-09-09 NOTE — Telephone Encounter (Signed)
Returned pt's call. The pt was just wanting to make sure she is doing everything correctly with her BM's b/c she has noticed that she has to take a laxative along with fiber to get them to move more. The pt is eating ok and using the bathroom. 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 I advised if her anything gets worse to call me.

## 2011-09-10 ENCOUNTER — Ambulatory Visit (HOSPITAL_COMMUNITY): Admission: RE | Admit: 2011-09-10 | Payer: 59 | Source: Ambulatory Visit | Admitting: Surgery

## 2011-09-10 SURGERY — LAPAROSCOPIC CHOLECYSTECTOMY SINGLE SITE
Anesthesia: General

## 2011-09-25 ENCOUNTER — Ambulatory Visit (INDEPENDENT_AMBULATORY_CARE_PROVIDER_SITE_OTHER): Payer: Self-pay | Admitting: Surgery

## 2011-09-25 ENCOUNTER — Encounter (INDEPENDENT_AMBULATORY_CARE_PROVIDER_SITE_OTHER): Payer: Self-pay | Admitting: Surgery

## 2011-09-25 VITALS — BP 116/84 | HR 73 | Temp 98.2°F | Ht 59.5 in | Wt 109.2 lb

## 2011-09-25 DIAGNOSIS — K8040 Calculus of bile duct with cholecystitis, unspecified, without obstruction: Secondary | ICD-10-CM

## 2011-09-25 DIAGNOSIS — K8044 Calculus of bile duct with chronic cholecystitis without obstruction: Secondary | ICD-10-CM

## 2011-09-25 NOTE — Progress Notes (Signed)
Subjective:     Patient ID: Cheryl Fields, female   DOB: 01/15/1961, 51 y.o.   MRN: [id number]  HPI  Cheryl Fields  April 07, 1960 [id number]  Patient Care Team: Johny Blamer, MD as PCP - General (Family Medicine) [name], MD as Consulting Physician (General Surgery) Shirley Friar, MD as Consulting Physician (Gastroenterology)  This patient is a 50 y.o.female who presents today for surgical evaluation.   Reason for visit: Followup status post cholecystectomy and ERCP August 2013  The patient comes in today feeling well overall.  Energy level good.  Muscle little bit away to coming back.  Appetite sometimes is fair with no real taste.  She is forcing food down now.  Feels like that's gradually improving.  No fevers chills or sweats.  Back to being rather active.  Bowels moving more regularly with the help of Align and Metamucil.  Overall feels satisfied.  Patient Active Problem List  Diagnosis  . Anxiety  . Migraines  . Choledocholithiasis with chronic cholecystitis  . Abdominal pain, acute, LUQ & epigastric  . Nausea & vomiting    Past Medical History  Diagnosis Date  . Hyperlipidemia   . Migraines   . Depression   . Anxiety   . GERD (gastroesophageal reflux disease)     Past Surgical History  Procedure Date  . Nasal septum surgery   . Cystectomy   . Tubal ligation   . Abdominal hysterectomy     partial  . Tonsillectomy   . Intraoperative cholangiogram 09/03/2011    Procedure: INTRAOPERATIVE CHOLANGIOGRAM;  Surgeon: [name], MD;  Location: [facility];  Service: General;;  . Ercp 09/04/2011    Procedure: ENDOSCOPIC RETROGRADE CHOLANGIOPANCREATOGRAPHY (ERCP);  Surgeon: Graylin Shiver, MD;  Location: [facility];  Service: Endoscopy;  Laterality: N/A;  . Cholecystectomy 09/03/11    History   Social History  . Marital Status: Married    Spouse Name: N/A    Number of Children: N/A  . Years of Education: N/A   Occupational History  . Not on  file.   Social History Main Topics  . Smoking status: Former Games developer  . Smokeless tobacco: Not on file  . Alcohol Use: No  . Drug Use: No  . Sexually Active:    Other Topics Concern  . Not on file   Social History Narrative  . No narrative on file    Family History  Problem Relation Age of Onset  . Cancer Mother     breast  . Cancer Paternal Grandmother     colon  . Cancer Paternal Grandfather     olon    Current Outpatient Prescriptions  Medication Sig Dispense Refill  . ALPRAZolam (XANAX) 0.5 MG tablet Take 0.5 mg by mouth as needed. For anxiety      . Aspirin-Caffeine 500-32.5 MG TABS Take 1 tablet by mouth every 8 (eight) hours as needed. Pain      . buPROPion (WELLBUTRIN XL) 150 MG 24 hr tablet Take 150 mg by mouth daily. Total of 450 mg      . buPROPion (WELLBUTRIN XL) 300 MG 24 hr tablet Take 300 mg by mouth daily. Total of 450 mg      . carisoprodol (SOMA) 350 MG tablet Take 350 mg by mouth 3 (three) times daily as needed. For neck pain      . Cholecalciferol (VITAMIN D) 400 UNITS capsule Take 800 Units by mouth daily.       Marland Kitchen estradiol (ESTRACE)  2 MG tablet Take 2 mg by mouth daily.      Marland Kitchen HYDROcodone-acetaminophen (VICODIN) 5-500 MG per tablet Take 1-2 tablets by mouth every 4 (four) hours as needed for pain. For pain  40 tablet  0  . pravastatin (PRAVACHOL) 40 MG tablet Take 40 mg by mouth daily.      . promethazine (PHENERGAN) 25 MG tablet Take 25 mg by mouth every 6 (six) hours as needed. For nausea      . rizatriptan (MAXALT) 10 MG tablet Take 10 mg by mouth as needed. May repeat in 2 hours if needed      . topiramate (TOPAMAX) 25 MG capsule Take 25 mg by mouth daily. Pt takes at bedtime         No Known Allergies  BP 116/84  Pulse 73  Temp 98.2 F (36.8 C) (Temporal)  Ht 4' 11.5" (1.511 m)  Wt 109 lb 3.2 oz (49.533 kg)  BMI 21.69 kg/m2  SpO2 96%  Dg Cholangiogram Operative  09/03/2011  *RADIOLOGY REPORT*  Clinical Data:   Abdominal pain   INTRAOPERATIVE CHOLANGIOGRAM  Technique:  Cholangiographic images from the C-arm fluoroscopic device were submitted for interpretation post-operatively.  Please see the procedural report for the amount of contrast and the fluoroscopy time utilized.  Comparison:  None.  Findings:  Contrast fills the biliary tree and duodenum compatible with patency.  Several filling defects are present in the distal common bile duct just proximal to the ampulla worrisome for common bile duct stones.  No evidence of bile leak.  IMPRESSION: Patent biliary tree.  Distal common bile duct stones are noted.  GI consultation noted in the operative report.  Original Report Authenticated By: Donavan Burnet, M.D.   Dg Ercp With Sphincterotomy  09/04/2011  *RADIOLOGY REPORT*  Clinical Data: Choledocholithiasis.  ERCP  Comparison:  None.  Technique:  Multiple spot images obtained with the fluoroscopic device and submitted for interpretation post-procedure.  ERCP was performed by Dr. Evette Cristal.  Findings: Contrast injection into the distal common bile duct shows no evidence of stricture or dilatation of the common duct or visualized intrahepatic bile ducts. No definite filling defects identified.  Subsequent images show sweeping of the common duct with a balloon occlusion catheter.  Final images show no persistent filling defects within the common hepatic or common bile ducts, with some contrast emptying into the duodenum demonstrated.  IMPRESSION: No evidence of biliary stricture or obstruction.  These images were submitted for radiologic interpretation only. Please see the procedural report for the amount of contrast and the fluoroscopy time utilized.  Original Report Authenticated By: Danae Orleans, M.D.     Review of Systems  Constitutional: Negative for fever, chills and diaphoresis.  HENT: Negative for ear pain, sore throat and trouble swallowing.   Eyes: Negative for photophobia and visual disturbance.  Respiratory: Negative for  cough and choking.   Cardiovascular: Negative for chest pain and palpitations.  Gastrointestinal: Negative for nausea, vomiting, abdominal pain, diarrhea, constipation, anal bleeding and rectal pain.  Genitourinary: Negative for dysuria, frequency and difficulty urinating.  Musculoskeletal: Negative for myalgias and gait problem.  Skin: Negative for color change, pallor and rash.  Neurological: Negative for dizziness, speech difficulty, weakness and numbness.  Hematological: Negative for adenopathy.  Psychiatric/Behavioral: Negative for confusion and agitation. The patient is not nervous/anxious.        Objective:   Physical Exam  Constitutional: She is oriented to person, place, and time. She appears well-developed and well-nourished. No distress.  HENT:  Head: Normocephalic.  Mouth/Throat: Oropharynx is clear and moist. No oropharyngeal exudate.  Eyes: Conjunctivae and EOM are normal. Pupils are equal, round, and reactive to light. No scleral icterus.  Neck: Normal range of motion. No tracheal deviation present.  Cardiovascular: Normal rate and intact distal pulses.   Pulmonary/Chest: Effort normal. No respiratory distress. She exhibits no tenderness.  Abdominal: Soft. She exhibits no distension. There is no tenderness. Hernia confirmed negative in the right inguinal area and confirmed negative in the left inguinal area.       Incisions clean with normal healing ridges.  No hernias  Genitourinary: No vaginal discharge found.  Musculoskeletal: Normal range of motion. She exhibits no tenderness.  Lymphadenopathy:       Right: No inguinal adenopathy present.       Left: No inguinal adenopathy present.  Neurological: She is alert and oriented to person, place, and time. No cranial nerve deficit. She exhibits normal muscle tone. Coordination normal.  Skin: Skin is warm and dry. No rash noted. She is not diaphoretic.  Psychiatric: She has a normal mood and affect. Her behavior is normal.         Assessment:     Three weeks status post cholecystectomy and ERCP.  Recovering rather well except for taste change.  That is improving as well.    Plan:     Increase activity as tolerated.  Do not push through pain.  Advanced on diet as tolerated. Bowel regimen to avoid problems.  I suspect the poor sense of taste will continue to recover as the next month or so passes along.  She feels reassured.  Return to clinic p.r.n. The patient expressed understanding and appreciation

## 2011-09-25 NOTE — Patient Instructions (Signed)

## 2011-10-06 ENCOUNTER — Telehealth (INDEPENDENT_AMBULATORY_CARE_PROVIDER_SITE_OTHER): Payer: Self-pay

## 2011-10-06 NOTE — Telephone Encounter (Signed)
Returned pt's call. The pt wants [place] to know that on her first dictation dated 08/13/11 by Dr Michaell Cowing that in the dictation it was mentioned that she went to the ER one time but the pt has never been to the ER for her gallbladder. The pt just wanted that clarified in her dictation but I told her I couldn't change it in her dictation but I would make a phone note of our conversation. The pt is ok with this info.

## 2012-02-22 ENCOUNTER — Other Ambulatory Visit: Payer: Self-pay | Admitting: Family Medicine

## 2012-02-22 DIAGNOSIS — Z1231 Encounter for screening mammogram for malignant neoplasm of breast: Secondary | ICD-10-CM

## 2012-03-01 ENCOUNTER — Other Ambulatory Visit: Payer: Self-pay | Admitting: Family Medicine

## 2012-03-01 DIAGNOSIS — R0789 Other chest pain: Secondary | ICD-10-CM

## 2012-03-02 ENCOUNTER — Other Ambulatory Visit: Payer: Self-pay | Admitting: Family Medicine

## 2012-03-02 DIAGNOSIS — R9389 Abnormal findings on diagnostic imaging of other specified body structures: Secondary | ICD-10-CM

## 2012-03-02 DIAGNOSIS — R0789 Other chest pain: Secondary | ICD-10-CM

## 2012-03-03 ENCOUNTER — Other Ambulatory Visit: Payer: Self-pay

## 2012-03-04 ENCOUNTER — Other Ambulatory Visit: Payer: Self-pay

## 2012-03-07 ENCOUNTER — Ambulatory Visit
Admission: RE | Admit: 2012-03-07 | Discharge: 2012-03-07 | Disposition: A | Discharge status: Home / Self Care | Payer: 59 | Source: Ambulatory Visit | Attending: Family Medicine | Admitting: Family Medicine

## 2012-03-07 DIAGNOSIS — R9389 Abnormal findings on diagnostic imaging of other specified body structures: Secondary | ICD-10-CM

## 2012-03-07 DIAGNOSIS — R0789 Other chest pain: Secondary | ICD-10-CM

## 2012-03-07 MED ORDER — IOHEXOL 300 MG/ML  SOLN
75.0000 mL | Freq: Once | INTRAMUSCULAR | Status: AC | PRN
  Administered 2012-03-07: 75 mL via INTRAVENOUS

## 2012-03-11 ENCOUNTER — Encounter: Payer: Self-pay | Admitting: Internal Medicine

## 2012-03-11 ENCOUNTER — Ambulatory Visit (INDEPENDENT_AMBULATORY_CARE_PROVIDER_SITE_OTHER): Payer: 59 | Admitting: Internal Medicine

## 2012-03-11 VITALS — BP 128/80 | HR 78 | Temp 97.9°F | Ht 59.0 in | Wt 107.0 lb

## 2012-03-11 DIAGNOSIS — R52 Pain, unspecified: Secondary | ICD-10-CM

## 2012-03-11 DIAGNOSIS — R0609 Other forms of dyspnea: Secondary | ICD-10-CM

## 2012-03-11 DIAGNOSIS — R911 Solitary pulmonary nodule: Secondary | ICD-10-CM

## 2012-03-11 DIAGNOSIS — R109 Unspecified abdominal pain: Secondary | ICD-10-CM

## 2012-03-11 DIAGNOSIS — R0989 Other specified symptoms and signs involving the circulatory and respiratory systems: Secondary | ICD-10-CM

## 2012-03-11 NOTE — Patient Instructions (Addendum)
Classic subdiaphragmatic pain pattern suggests ibs:  Stereotypical, migratory with a very limited distribution of pain locations, daytime, not exacerbated by ex or coughing, worse in sitting position, associated with generalized abd bloating, gradually gets better supine due to the dome effect of the diaphragm is  canceled in that position. Frequently these patients have had multiple negative GI workups and CT scans.  Treatment consists of avoiding foods that cause gas (especially beans and raw vegetables like spinach and salads)  and citrucel 1 heaping tsp twice daily with a large glass of water.  Pain should improve w/in 2 weeks and if not then consider further GI work up by gastrologist = splenic flexure syndrome.  GERD (REFLUX)  is an extremely common cause of respiratory symptoms, many times with no significant heartburn at all.    It can be treated with medication, but also with lifestyle changes including avoidance of late meals, excessive alcohol, smoking cessation, and avoid fatty foods, chocolate, peppermint, colas, red wine, and acidic juices such as orange juice.  NO MINT OR MENTHOL PRODUCTS SO NO COUGH DROPS  USE SUGARLESS CANDY INSTEAD (jolley ranchers or Stover's)  NO OIL BASED VITAMINS - use powdered substitutes.    No further pulmonary evaluation for the chest pain and best treatment for your breathing is aerobic exercise.    If you are satisfied with your treatment plan let your doctor know and he/she can either refill your medications or you can return here when your prescription runs out.     If in any way you are not 100% satisfied,  please tell us.  If 100% better, tell your friends!

## 2012-03-11 NOTE — Progress Notes (Signed)
  Subjective:    Patient ID: Cheryl Fields, female    DOB: 30-Oct-1960 MRN: [id number]  HPI  56 yowf never smoker with some nasal allergies on minimal otc referred 03/11/2012  by Dr Leonides Sake for sob x 2012 and cp 2013.   03/11/2012 1st pulmonary eval/ Cheryl Fields ov cc two problems not historically  related to one another.  1) 2 years of intermittently sob not produced by exertion on avg every other day, lasts up an hour and then resolves on its own, with sensation can't get a deep enough breath at rest, has xanax but doesn't know if it helps. Has had panic attacks "forever" but they don't typically cause sob x in shower.  Never wakes with it, no assoc circum oral numbness or muscle cramping   2) 1 year  Daily LCP starts LUQ and occ rad to L shoulder always anterior, exact same location,   seems better supine typically upright for several hours before onset every day, and has noted more gas no better metamcil or antacids/ ppi or cholecystectomy. F/u by GI Sam G  Sleeping ok without nocturnal  or early am exacerbation  of respiratory  c/o's or need for noct saba. Also denies any obvious fluctuation of symptoms with weather or environmental changes or other aggravating or alleviating factors except as outlined above   Review of Systems  Constitutional: Negative for fever and unexpected weight change.  HENT: Positive for sneezing. Negative for ear pain, nosebleeds, congestion, sore throat, rhinorrhea, trouble swallowing, dental problem, postnasal drip and sinus pressure.   Eyes: Negative for redness and itching.  Respiratory: Positive for shortness of breath. Negative for cough, chest tightness and wheezing.   Cardiovascular: Positive for leg swelling. Negative for palpitations.  Gastrointestinal: Positive for nausea. Negative for vomiting.  Genitourinary: Negative for dysuria.  Musculoskeletal: Negative for joint swelling.  Skin: Negative for rash.  Neurological: Positive for headaches.   Hematological: Does not bruise/bleed easily.  Psychiatric/Behavioral: Positive for dysphoric mood. The patient is nervous/anxious.        Objective:   Physical Exam   Anxious wf nad Wt Readings from Last 3 Encounters:  03/11/12 107 lb (48.535 kg)  09/25/11 109 lb 3.2 oz (49.533 kg)  09/03/11 112 lb 2 oz (50.86 kg)    HEENT: nl dentition, turbinates, and orophanx. Nl external ear canals without cough reflex   NECK :  without JVD/Nodes/TM/ nl carotid upstrokes bilaterally   LUNGS: no acc muscle use, clear to A and P bilaterally without cough on insp or exp maneuvers   CV:  RRR  no s3 or murmur or increase in P2, no edema   ABD:  soft and nontender with nl excursion in the supine position. No bruits or organomegaly, bowel sounds nl  MS:  warm without deformities, calf tenderness, cyanosis or clubbing  SKIN: warm and dry without lesions    NEURO:  alert, approp, no deficits    Ct chest 03/02/12 1. Single noncalcified 5-mm nodule in the left lower lobe.  Followup recommendations are given above.  2. No mediastinal or hilar adenopathy.  3. Moderate sized hiatal hernia.      Assessment & Plan:

## 2012-03-12 NOTE — Assessment & Plan Note (Signed)

## 2012-03-12 NOTE — Assessment & Plan Note (Signed)
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

## 2012-03-12 NOTE — Assessment & Plan Note (Addendum)
See CTa 03/07/12  X 4 mm LLL > never smoker, very low risk >  in tickle file for recall to pulmonary clinic 03/07/2013 but really f/u is optional, not mandatory in this setting per guidelines and the nodule this small of course has nothing to do with her cp

## 2012-06-10 ENCOUNTER — Other Ambulatory Visit: Payer: Self-pay | Admitting: Family Medicine

## 2012-06-10 DIAGNOSIS — N63 Unspecified lump in unspecified breast: Secondary | ICD-10-CM

## 2012-06-17 ENCOUNTER — Ambulatory Visit
Admission: RE | Admit: 2012-06-17 | Discharge: 2012-06-17 | Disposition: A | Discharge status: Home / Self Care | Payer: 59 | Source: Ambulatory Visit | Attending: Family Medicine | Admitting: Family Medicine

## 2012-06-17 DIAGNOSIS — N63 Unspecified lump in unspecified breast: Secondary | ICD-10-CM

## 2012-06-21 ENCOUNTER — Other Ambulatory Visit: Payer: 59

## 2012-06-22 ENCOUNTER — Other Ambulatory Visit: Payer: 59

## 2012-08-12 ENCOUNTER — Ambulatory Visit (HOSPITAL_COMMUNITY)
Admission: RE | Admit: 2012-08-12 | Discharge: 2012-08-12 | Disposition: A | Discharge status: Home / Self Care | Payer: 59 | Source: Ambulatory Visit | Attending: Family Medicine | Admitting: Family Medicine

## 2012-08-12 ENCOUNTER — Other Ambulatory Visit (HOSPITAL_COMMUNITY): Payer: Self-pay | Admitting: Family Medicine

## 2012-08-12 DIAGNOSIS — M5030 Other cervical disc degeneration, unspecified cervical region: Secondary | ICD-10-CM | POA: Insufficient documentation

## 2012-08-12 DIAGNOSIS — M542 Cervicalgia: Secondary | ICD-10-CM

## 2012-08-12 DIAGNOSIS — M47812 Spondylosis without myelopathy or radiculopathy, cervical region: Secondary | ICD-10-CM | POA: Insufficient documentation

## 2013-07-14 ENCOUNTER — Other Ambulatory Visit: Payer: Self-pay

## 2013-07-14 DIAGNOSIS — Z1231 Encounter for screening mammogram for malignant neoplasm of breast: Secondary | ICD-10-CM

## 2013-07-17 ENCOUNTER — Other Ambulatory Visit: Payer: Self-pay | Admitting: Family Medicine

## 2013-07-17 DIAGNOSIS — R911 Solitary pulmonary nodule: Secondary | ICD-10-CM

## 2013-08-11 ENCOUNTER — Other Ambulatory Visit: Payer: 59

## 2013-08-11 ENCOUNTER — Ambulatory Visit: Payer: 59

## 2014-03-07 ENCOUNTER — Other Ambulatory Visit: Payer: Self-pay | Admitting: Gastroenterology

## 2014-03-13 ENCOUNTER — Other Ambulatory Visit: Payer: Self-pay | Admitting: Gastroenterology

## 2014-03-13 DIAGNOSIS — R1013 Epigastric pain: Secondary | ICD-10-CM

## 2014-03-15 ENCOUNTER — Ambulatory Visit
Admission: RE | Admit: 2014-03-15 | Discharge: 2014-03-15 | Disposition: A | Discharge status: Home / Self Care | Payer: BLUE CROSS/BLUE SHIELD | Source: Ambulatory Visit | Attending: Gastroenterology | Admitting: Gastroenterology

## 2014-03-15 DIAGNOSIS — R1013 Epigastric pain: Secondary | ICD-10-CM

## 2014-05-07 ENCOUNTER — Ambulatory Visit (HOSPITAL_COMMUNITY)
Admission: RE | Admit: 2014-05-07 | Discharge: 2014-05-07 | Disposition: A | Discharge status: Home / Self Care | Payer: BLUE CROSS/BLUE SHIELD | Source: Ambulatory Visit | Attending: Gastroenterology | Admitting: Gastroenterology

## 2014-05-07 ENCOUNTER — Encounter (HOSPITAL_COMMUNITY)
Admission: RE | Disposition: A | Discharge status: Home / Self Care | Payer: Self-pay | Source: Ambulatory Visit | Attending: Gastroenterology

## 2014-05-07 DIAGNOSIS — K228 Other specified diseases of esophagus: Secondary | ICD-10-CM | POA: Insufficient documentation

## 2014-05-07 HISTORY — PX: ESOPHAGEAL MANOMETRY: SHX5429

## 2014-05-07 SURGERY — MANOMETRY, ESOPHAGUS

## 2014-05-07 MED ORDER — LIDOCAINE VISCOUS 2 % MT SOLN
OROMUCOSAL | Status: AC
  Filled 2014-05-07: qty 15

## 2014-05-07 SURGICAL SUPPLY — 2 items
FACESHIELD LNG OPTICON STERILE (SAFETY) IMPLANT
GLOVE BIO SURGEON STRL SZ8 (GLOVE) ×6 IMPLANT

## 2014-05-08 ENCOUNTER — Encounter (HOSPITAL_COMMUNITY): Payer: Self-pay | Admitting: Gastroenterology

## 2014-05-16 ENCOUNTER — Other Ambulatory Visit: Payer: Self-pay | Admitting: Surgery

## 2014-05-16 NOTE — H&P (Signed)
Garnetta Buddy Sesler 05/16/2014 11:24 AM Location: [facility] Patient #: [id number] DOB: Jul 21, 1960 Married / Language: English / Race: White Female History of Present Illness [name] MD; 05/16/2014 12:56 PM) Patient words: hernia.  The patient is a 54 year old female who presents with a hiatal hernia. Patient sent by her gastroenterologist, Dr. Dorena Cookey, for concern of a hiatal hernia.  Woman with abdominal complaints in the past. Declined gastroenterology evaluation at that time. Gallstones. Gastric emptying stidy normal. Cardiac workup normal. I perform cholecystectomy on her. She required ERCP for choledocholithiasis. Recovered from that rather well.  Patient always had some heartburn and reflux. Usually mild and controlled with over-the-counter medications such as Alka-Seltzer. Worsened. More LEFT upper quadrant pain. No improvement on proton pump inhibitors including Dexilant. Worsening symptoms. EGD confirmed larger hiatal hernia. Concern for paraesophageal component. Upper GI and chest CT also noted hiatal hernia. Dr Madilyn Fireman discussed with me. Manometry done. Some hypertensive contractions but no major dysmotility. Because of her worsening symptoms and failure on medical therapy, surgical consultation requested.  She can walk 30 minutes without difficulty. She does have some constipation issues with a bowel movement about every 3 days. Sometimes a week. Phenergan seems to control her nausea and emesis. No episodes of jaundice. Some depression and anxiety but controlled. She does not smoke  Other Problems Fay Records, CMA; 05/16/2014 11:24 AM) Anxiety Disorder Back Pain Depression Gastroesophageal Reflux Disease Hemorrhoids Hypercholesterolemia Migraine Headache  Past Surgical History Fay Records, CMA; 05/16/2014 11:24 AM) Gallbladder Surgery - Laparoscopic Hysterectomy (not due to cancer) - Partial Tonsillectomy  Diagnostic  Studies History Fay Records, CMA; 05/16/2014 11:24 AM) Colonoscopy within last year Mammogram 1-3 years ago  Allergies Fay Records, CMA; 05/16/2014 11:25 AM) Voltaren *DERMATOLOGICALS* ASA Arthritis Strength/Antacid *ANALGESICS - NonNarcotic*  Medication History Fay Records, CMA; 05/16/2014 11:28 AM) BuPROPion HCl ER (XL) (  Tablet ER 24HR, Oral three times daily) Active. Estradiol (  Tablet, Oral daily) Active. Pravastatin Sodium (  Tablet, Oral as needed) Active. ALPRAZolam (0.5MG  Tablet Disperse, Oral two times daily) Active. Promethazine HCl (  Tablet, Oral daily at bedtime) Active: migraine. Hydrocodone-Homatropine (5-1.5MG  Tablet, Oral) Active. Medications Reconciled  Social History Fay Records, [place]; 05/16/2014 11:24 AM) Caffeine use Carbonated beverages, Coffee, Tea. No alcohol use No drug use Tobacco use Never smoker.  Family History Fay Records, [place]; 05/16/2014 11:24 AM) Breast Cancer Mother. Depression Mother. Heart Disease Mother. Migraine Headache Mother.  Pregnancy / Birth History Fay Records, CMA; 05/16/2014 11:24 AM) Age at menarche 13 years. Age of menopause <43 Gravida 2 Maternal age 63-20 Para 1     Review of Systems Fay Records CMA; 05/16/2014 11:25 AM) General Present- Fatigue. Not Present- Appetite Loss, Chills, Fever, Night Sweats, Weight Gain and Weight Loss. Skin Present- Dryness. Not Present- Change in Wart/Mole, Hives, Jaundice, New Lesions, Non-Healing Wounds, Rash and Ulcer. HEENT Present- Hearing Loss, Ringing in the Ears, Visual Disturbances and Wears glasses/contact lenses. Not Present- Earache, Hoarseness, Nose Bleed, Oral Ulcers, Seasonal Allergies, Sinus Pain, Sore Throat and Yellow Eyes. Respiratory Not Present- Bloody sputum, Chronic Cough, Difficulty Breathing, Snoring and Wheezing. Breast Present- Breast Mass. Not Present- Breast Pain, Nipple Discharge and Skin Changes. Cardiovascular Not Present-  Chest Pain, Difficulty Breathing Lying Down, Leg Cramps, Palpitations, Rapid Heart Rate, Shortness of Breath and Swelling of Extremities. Gastrointestinal Present- Abdominal Pain, Bloating, Excessive gas, Gets full quickly at meals, Hemorrhoids, Indigestion, Nausea and Vomiting. Not Present- Bloody Stool, Change in Bowel Habits, Chronic diarrhea, Constipation, Difficulty Swallowing and Rectal Pain. Female  Genitourinary Not Present- Frequency, Nocturia, Painful Urination, Pelvic Pain and Urgency. Musculoskeletal Present- Back Pain, Joint Pain and Joint Stiffness. Not Present- Muscle Pain, Muscle Weakness and Swelling of Extremities. Neurological Present- Headaches. Not Present- Decreased Memory, Fainting, Numbness, Seizures, Tingling, Tremor, Trouble walking and Weakness. Psychiatric Present- Anxiety and Depression. Not Present- Bipolar, Change in Sleep Pattern, Fearful and Frequent crying. Endocrine Not Present- Cold Intolerance, Excessive Hunger, Hair Changes, Heat Intolerance, Hot flashes and New Diabetes. Hematology Present- Easy Bruising. Not Present- Excessive bleeding, Gland problems, HIV and Persistent Infections.  Vitals Fay Records(Ashley Beck CMA; 05/16/2014 11:29 AM) 05/16/2014 11:28 AM Weight: 108 lb Height: 58in Body Surface Area: 1.42 m Body Mass Index: 22.57 kg/m Temp.: 98.90F(Oral)  Pulse: 75 (Regular)  Resp.: 17 (Unlabored)  BP: 120/66 (Sitting, Left Arm, Standard)     Physical Exam [name](Erikah Thumm C. Levie Owensby MD; 05/16/2014 12:56 PM)  General Mental Status-Alert. General Appearance-Not in acute distress, Not Sickly. Orientation-Oriented X3. Hydration-Well hydrated. Voice-Normal.  Integumentary Global Assessment Upon inspection and palpation of skin surfaces of the - Axillae: non-tender, no inflammation or ulceration, no drainage. and Distribution of scalp and body hair is normal. General Characteristics Temperature - normal warmth is noted.  Head and  Neck Head-normocephalic, atraumatic with no lesions or palpable masses. Face Global Assessment - atraumatic, no absence of expression. Neck Global Assessment - no abnormal movements, no bruit auscultated on the right, no bruit auscultated on the left, no decreased range of motion, non-tender. Trachea-midline. Thyroid Gland Characteristics - non-tender.  Eye Eyeball - Left-Extraocular movements intact, No Nystagmus. Eyeball - Right-Extraocular movements intact, No Nystagmus. Cornea - Left-No Hazy. Cornea - Right-No Hazy. Sclera/Conjunctiva - Left-No scleral icterus, No Discharge. Sclera/Conjunctiva - Right-No scleral icterus, No Discharge. Pupil - Left-Direct reaction to light normal. Pupil - Right-Direct reaction to light normal.  ENMT Ears Pinna - Left - no drainage observed, no generalized tenderness observed. Right - no drainage observed, no generalized tenderness observed. Nose and Sinuses External Inspection of the Nose - no destructive lesion observed. Inspection of the nares - Left - quiet respiration. Right - quiet respiration. Mouth and Throat Lips - Upper Lip - no fissures observed, no pallor noted. Lower Lip - no fissures observed, no pallor noted. Nasopharynx - no discharge present. Oral Cavity/Oropharynx - Tongue - no dryness observed. Oral Mucosa - no cyanosis observed. Hypopharynx - no evidence of airway distress observed.  Chest and Lung Exam Inspection Movements - Normal and Symmetrical. Accessory muscles - No use of accessory muscles in breathing. Palpation Palpation of the chest reveals - Non-tender. Auscultation Breath sounds - Normal and Clear.  Cardiovascular Auscultation Rhythm - Regular. Murmurs & Other Heart Sounds - Auscultation of the heart reveals - No Murmurs and No Systolic Clicks.  Abdomen Inspection Inspection of the abdomen reveals - No Visible peristalsis and No Abnormal pulsations. Umbilicus - No Bleeding, No Urine  drainage. Palpation/Percussion Palpation and Percussion of the abdomen reveal - Soft, Non Tender, No Rebound tenderness, No Rigidity (guarding) and No Cutaneous hyperesthesia. Note: Abdomen soft and flat. Periumbilical incision. No hernia.   Female Genitourinary Sexual Maturity Tanner 5 - Adult hair pattern. Note: No vaginal bleeding nor discharge   Peripheral Vascular Upper Extremity Inspection - Left - No Cyanotic nailbeds, Not Ischemic. Right - No Cyanotic nailbeds, Not Ischemic.  Neurologic Neurologic evaluation reveals -normal attention span and ability to concentrate, able to name objects and repeat phrases. Appropriate fund of knowledge , normal sensation and normal coordination. Mental Status Affect - not angry, not paranoid. Cranial  Nerves-Normal Bilaterally. Gait-Normal.  Neuropsychiatric Mental status exam performed with findings of-able to articulate well with normal speech/language, rate, volume and coherence, thought content normal with ability to perform basic computations and apply abstract reasoning and no evidence of hallucinations, delusions, obsessions or homicidal/suicidal ideation.  Musculoskeletal Global Assessment Spine, Ribs and Pelvis - no instability, subluxation or laxity. Right Upper Extremity - no instability, subluxation or laxity.  Lymphatic Head & Neck  General Head & Neck Lymphatics: Bilateral - Description - No Localized lymphadenopathy. Axillary  General Axillary Region: Bilateral - Description - No Localized lymphadenopathy. Femoral & Inguinal  Generalized Femoral & Inguinal Lymphatics: Left - Description - No Localized lymphadenopathy. Right - Description - No Localized lymphadenopathy.    Assessment & Plan [name] MD; 05/16/2014 12:57 PM)  PARAESOPHAGEAL HIATAL HERNIA (553.3  K44.9) Impression: Moderate paraesophageal hiatal hernia. Worsening symptoms of pain and nausea vomiting. Concerning for partial  obstruction.  I think this will require surgical repair. Laparoscopic versus robotic fundoplication repair. Manometry notes her motility is strong enough to tolerate a fundoplication. Mesh reinforcement more likely: biologic versus small sheet of ultrapro mesh reinforcement to avoid recurrence given younger age  Current Plans Schedule for Surgery Written instructions provided  The anatomy & physiology of the foregut and anti-reflux mechanism was discussed. The pathophysiology of hiatal herniation and GERD was discussed. Natural history risks without surgery was discussed. The patient's symptoms are not adequately controlled by medicines and other non-operative treatments. I feel the risks of no intervention will lead to serious problems that outweigh the operative risks; therefore, I recommended surgery to reduce the hiatal hernia out of the chest and fundoplication to rebuild the anti-reflux valve and control reflux better. Need for a thorough workup to rule out the differential diagnosis and plan treatment was explained. I explained laparoscopic techniques with possible need for an open approach.  Risks such as bleeding, infection, abscess, leak, need for further treatment, heart attack, death, and other risks were discussed. I noted a good likelihood this will help address the problem. Goals of post-operative recovery were discussed as well. Possibility that this will not correct all symptoms was explained. Post-operative dysphagia, need for short-term liquid & pureed diet, inability to vomit, possibility of reherniation, possible need for medicines to help control symptoms in addition to surgery were discussed. We will work to minimize complications. Educational handouts further explaining the pathology, treatment options, and dysphagia diet was given as well. Questions were answered. The patient expresses understanding & wishes to proceed with surgery.  Pt Education - Esophageal surgery diet  instructions: discussed with patient and provided information. Discussed regular exercise with patient. Pt Education - [facility] Laparosopic Post Op HCI (Caasi Giglia) Pt Education - [facility] Good Bowel Health (Tylee Newby)

## 2015-03-22 ENCOUNTER — Other Ambulatory Visit: Payer: Self-pay | Admitting: Internal Medicine

## 2015-03-22 DIAGNOSIS — Z1231 Encounter for screening mammogram for malignant neoplasm of breast: Secondary | ICD-10-CM

## 2015-04-03 ENCOUNTER — Other Ambulatory Visit: Payer: Self-pay | Admitting: Surgery

## 2015-04-03 NOTE — H&P (Signed)
Cheryl Fields Start 04/03/2015 11:49 AM Location: [facility] Patient #: [id number] DOB: 1960/07/25 Married / Language: [place] / Race: White Female  History of Present Illness [name] MD; 04/03/2015 12:21 PM) Patient words: reck.  The patient is a 55 year old female who presents with a hiatal hernia. Patient sent by her gastroenterologist, Dr. Dorena Cookey, for concern of a hiatal hernia.  Woman with abdominal complaints in the past. Declined gastroenterology evaluation at that time. Gallstones. Gastric emptying stidy normal. Cardiac workup normal. I performed cholecystectomy on her. She required ERCP for choledocholithiasis. Recovered from that rather well.  Patient always had some heartburn and reflux. Usually mild and controlled with over-the-counter medications such as Alka-Seltzer. Worsened. More LEFT upper quadrant pain. No improvement on proton pump inhibitors including Dexilant. Worsening symptoms. EGD confirmed larger hiatal hernia. Concern for paraesophageal component. Upper GI and chest CT also noted hiatal hernia. Dr Madilyn Fireman discussed with me. Manometry done. Some hypertensive contractions but no major dysmotility. Because of her worsening symptoms and failure on medical therapy, surgical consultation requested. I was concerned about a worsening symptomatic paraesophageal hiatal hernia with worsening dysphagia him a poorly controlled heartburn, postprandial pain. Recommended surgery last year. Because of insurance and other financial issues, she tried to hold off. However she's having more frequent episodes of pain and nausea or vomiting. She is unintentionally lost some weight. Trying to focus on proteins and power shakes.  She can walk 30 minutes without difficulty. She does have some constipation issues with a bowel movement about every 3 days. Sometimes a week. A little better with a bowel regimen now. Phenergan seems to control her nausea  and emesis. No episodes of jaundice. Some depression and anxiety but controlled. She does not smoke   Problem List/Past Medical [name], MD; 04/03/2015 12:19 PM) PARAESOPHAGEAL HIATAL HERNIA (K44.9)  Other Problems [name], MD; 04/03/2015 12:19 PM) Anxiety Disorder Back Pain Depression Gastroesophageal Reflux Disease Hemorrhoids Hypercholesterolemia Migraine Headache  Past Surgical History [name], MD; 04/03/2015 12:19 PM) Gallbladder Surgery - Laparoscopic Hysterectomy (not due to cancer) - Partial Tonsillectomy  Diagnostic Studies History [name], MD; 04/03/2015 12:19 PM) Colonoscopy within last year Mammogram 1-3 years ago  Allergies Cheryl Fields, CMA; 04/03/2015 11:50 AM) Voltaren *DERMATOLOGICALS* ASA Arthritis Strength/Antacid *ANALGESICS - NonNarcotic*  Medication History (Cheryl Fields, CMA; 04/03/2015 11:52 AM) BuPROPion HCl ER (XL) (  Tablet ER 24HR, Oral three times daily) Active. Estradiol (  Tablet, Oral daily) Active. Pravastatin Sodium (  Tablet, Oral as needed) Active. ALPRAZolam (0.5MG  Tablet Disperse, Oral two times daily) Active. Promethazine HCl (  Tablet, Oral daily at bedtime) Active. Hydrocodone-Homatropine (5-1.5MG  Tablet, Oral) Active. Medications Reconciled Estradiol (1.5MG  Tablet, Oral) Active. Soma (  Tablet, Oral) Active. Vitamin D (Cholecalciferol) (400UNIT Tablet, Oral) Active.  Social History [name], MD; 04/03/2015 12:19 PM) Caffeine use Carbonated beverages, Coffee, Tea. No alcohol use No drug use Tobacco use Never smoker.  Family History [name], MD; 04/03/2015 12:19 PM) Breast Cancer Mother. Depression Mother. Heart Disease Mother. Migraine Headache Mother.  Pregnancy / Birth History [name], MD; 04/03/2015 12:19 PM) Age at menarche 13 years. Age of menopause <43 Gravida 2 Maternal age 62-20 Para 1     Review of Systems  [name], MD; 04/03/2015 12:19 PM) General Present- Fatigue. Not Present- Appetite Loss, Chills, Fever, Night Sweats, Weight Gain and Weight Loss. Skin Present- Dryness. Not Present- Change in Wart/Mole, Hives, Jaundice, New Lesions, Non-Healing Wounds, Rash and Ulcer. HEENT Present-  Hearing Loss, Ringing in the Ears, Visual Disturbances and Wears glasses/contact lenses. Not Present- Earache, Hoarseness, Nose Bleed, Oral Ulcers, Seasonal Allergies, Sinus Pain, Sore Throat and Yellow Eyes. Respiratory Not Present- Bloody sputum, Chronic Cough, Difficulty Breathing, Snoring and Wheezing. Breast Present- Breast Mass. Not Present- Breast Pain, Nipple Discharge and Skin Changes. Cardiovascular Not Present- Chest Pain, Difficulty Breathing Lying Down, Leg Cramps, Palpitations, Rapid Heart Rate, Shortness of Breath and Swelling of Extremities. Gastrointestinal Present- Abdominal Pain, Bloating, Excessive gas, Gets full quickly at meals, Hemorrhoids, Indigestion, Nausea and Vomiting. Not Present- Bloody Stool, Change in Bowel Habits, Chronic diarrhea, Constipation, Difficulty Swallowing and Rectal Pain. Female Genitourinary Not Present- Frequency, Nocturia, Painful Urination, Pelvic Pain and Urgency. Musculoskeletal Present- Back Pain, Joint Pain and Joint Stiffness. Not Present- Muscle Pain, Muscle Weakness and Swelling of Extremities. Neurological Present- Headaches. Not Present- Decreased Memory, Fainting, Numbness, Seizures, Tingling, Tremor, Trouble walking and Weakness. Psychiatric Present- Anxiety and Depression. Not Present- Bipolar, Change in Sleep Pattern, Fearful and Frequent crying. Endocrine Not Present- Cold Intolerance, Excessive Hunger, Hair Changes, Heat Intolerance, Hot flashes and New Diabetes. Hematology Present- Easy Bruising. Not Present- Excessive bleeding, Gland problems, HIV and Persistent Infections.  Vitals (Cheryl Fields CMA; 04/03/2015 11:50 AM) 04/03/2015 11:49 AM Weight: 99  lb Height: 58in Body Surface Area: 1.35 m Body Mass Index: 20.69 kg/m  Temp.: 62F(Temporal)  Pulse: 81 (Regular)  BP: 126/74 (Sitting, Left Arm, Standard)      Physical Exam [name](Filicia Scogin C. Bostyn Kunkler MD; 04/03/2015 12:19 PM)  General Mental Status-Alert. General Appearance-Not in acute distress, Not Sickly. Orientation-Oriented X3. Hydration-Well hydrated. Voice-Normal. Note: Thin but not cachectic.  Integumentary Global Assessment Upon inspection and palpation of skin surfaces of the - Axillae: non-tender, no inflammation or ulceration, no drainage. and Distribution of scalp and body hair is normal. General Characteristics Temperature - normal warmth is noted.  Head and Neck Head-normocephalic, atraumatic with no lesions or palpable masses. Face Global Assessment - atraumatic, no absence of expression. Neck Global Assessment - no abnormal movements, no bruit auscultated on the right, no bruit auscultated on the left, no decreased range of motion, non-tender. Trachea-midline. Thyroid Gland Characteristics - non-tender.  Eye Eyeball - Left-Extraocular movements intact, No Nystagmus. Eyeball - Right-Extraocular movements intact, No Nystagmus. Cornea - Left-No Hazy. Cornea - Right-No Hazy. Sclera/Conjunctiva - Left-No scleral icterus, No Discharge. Sclera/Conjunctiva - Right-No scleral icterus, No Discharge. Pupil - Left-Direct reaction to light normal. Pupil - Right-Direct reaction to light normal. Note: Continues to wear glasses.  ENMT Ears Pinna - Left - no drainage observed, no generalized tenderness observed. Right - no drainage observed, no generalized tenderness observed. Nose and Sinuses External Inspection of the Nose - no destructive lesion observed. Inspection of the nares - Left - quiet respiration. Right - quiet respiration. Mouth and Throat Lips - Upper Lip - no fissures observed, no pallor noted. Lower Lip - no  fissures observed, no pallor noted. Nasopharynx - no discharge present. Oral Cavity/Oropharynx - Tongue - no dryness observed. Oral Mucosa - no cyanosis observed. Hypopharynx - no evidence of airway distress observed.  Chest and Lung Exam Inspection Movements - Normal and Symmetrical. Accessory muscles - No use of accessory muscles in breathing. Palpation Palpation of the chest reveals - Non-tender. Auscultation Breath sounds - Normal and Clear.  Cardiovascular Auscultation Rhythm - Regular. Murmurs & Other Heart Sounds - Auscultation of the heart reveals - No Murmurs and No Systolic Clicks.  Abdomen Inspection Inspection of the abdomen reveals - No Visible peristalsis and No  Abnormal pulsations. Umbilicus - No Bleeding, No Urine drainage. Palpation/Percussion Palpation and Percussion of the abdomen reveal - Soft, Non Tender, No Rebound tenderness, No Rigidity (guarding) and No Cutaneous hyperesthesia. Note: Abdomen soft and flat. Periumbilical incision. No hernia.  Female Genitourinary Sexual Maturity Tanner 5 - Adult hair pattern. Note: No vaginal bleeding nor discharge. No lymphadenopathy. No inguinal hernias.  Peripheral Vascular Upper Extremity Inspection - Left - No Cyanotic nailbeds, Not Ischemic. Right - No Cyanotic nailbeds, Not Ischemic.  Neurologic Neurologic evaluation reveals -normal attention span and ability to concentrate, able to name objects and repeat phrases. Appropriate fund of knowledge , normal sensation and normal coordination. Mental Status Affect - not angry, not paranoid. Cranial Nerves-Normal Bilaterally. Gait-Normal.  Neuropsychiatric Mental status exam performed with findings of-able to articulate well with normal speech/language, rate, volume and coherence, thought content normal with ability to perform basic computations and apply abstract reasoning and no evidence of hallucinations, delusions, obsessions or homicidal/suicidal  ideation.  Musculoskeletal Global Assessment Spine, Ribs and Pelvis - no instability, subluxation or laxity. Right Upper Extremity - no instability, subluxation or laxity.  Lymphatic Head & Neck  General Head & Neck Lymphatics: Bilateral - Description - No Localized lymphadenopathy. Axillary  General Axillary Region: Bilateral - Description - No Localized lymphadenopathy. Femoral & Inguinal  Generalized Femoral & Inguinal Lymphatics: Left - Description - No Localized lymphadenopathy. Right - Description - No Localized lymphadenopathy.    Assessment & Plan   PARAESOPHAGEAL HIATAL HERNIA (K44.9) Impression: Moderate paraesophageal hiatal hernia. Poorly controlled reflux. Worsening symptoms of pain and nausea/vomiting. Concerning for partial obstruction.  I still recommended surgical repair. Laparoscopic versus robotic fundoplication repair. Manometry notes her motility is strong enough to tolerate a fundoplication. Mesh reinforcement more likely: biologic versus small sheet of ultrapro mesh reinforcement to avoid recurrence given younger age.  Things have gradually worsened over the past year. She is in a better financial situation. The patient & her husband wish to be aggressive and proceed with surgery now.  Current Plans You are being scheduled for surgery - Our schedulers will call you.  You should hear from our office's scheduling department within 5 working days about the location, date, and time of surgery. We try to make accommodations for patient's preferences in scheduling surgery, but sometimes the OR schedule or the surgeon's schedule prevents [place] from making those accommodations.  If you have not heard from our office [phone]) in 5 working days, call the office and ask for your surgeon's nurse.  If you have other questions about your diagnosis, plan, or surgery, call the office and ask for your surgeon's nurse.  Pt Education - [facility] Esophageal Surgery Diet HCI  (Saidy Ormand): discussed with patient and provided information. Pt Education - [facility] Laparoscopic Surgery HCI The anatomy & physiology of the foregut and anti-reflux mechanism was discussed. The pathophysiology of hiatal herniation and GERD was discussed. Natural history risks without surgery was discussed. The patient's symptoms are not adequately controlled by medicines and other non-operative treatments. I feel the risks of no intervention will lead to serious problems that outweigh the operative risks; therefore, I recommended surgery to reduce the hiatal hernia out of the chest and fundoplication to rebuild the anti-reflux valve and control reflux better. Need for a thorough workup to rule out the differential diagnosis and plan treatment was explained. I explained laparoscopic techniques with possible need for an open approach.  Risks such as bleeding, infection, abscess, leak, need for further treatment, heart attack, death, and other risks  were discussed. I noted a good likelihood this will help address the problem. Goals of post-operative recovery were discussed as well. Possibility that this will not correct all symptoms was explained. Post-operative dysphagia, need for short-term liquid & pureed diet, inability to vomit, possibility of reherniation, possible need for medicines to help control symptoms in addition to surgery were discussed. We will work to minimize complications. Educational handouts further explaining the pathology, treatment options, and dysphagia diet was given as well. Questions were answered. The patient expresses understanding & wishes to proceed with surgery.  [name], M.D., F.A.C.S. Gastrointestinal and Minimally Invasive Surgery [facility] [address], [address] [place], [place] [address] [phone] Main / Paging

## 2015-05-22 NOTE — Patient Instructions (Addendum)
Higinio PlanFrances D Bolduc  05/22/2015   Your procedure is scheduled on: 05-29-15  [facility]  [facility] to 3rd floor to  Short Stay Center at 630 AM.  Call this number if you have problems the morning of surgery [phone]   Remember: ONLY 1 PERSON MAY GO WITH YOU TO SHORT STAY TO GET  READY MORNING OF YOUR SURGERY.  Do not eat food or drink liquids :After Midnight.     Take these medicines the morning of surgery with A SIP OF WATER: ALPRAZOLAM(XANAX) IF NEEDED, BUPROPION (WELLBUTRIN), HYDROCODONE (VICODIN)                               You may not have any metal on your body including hair pins and              piercings  Do not wear jewelry, make-up, lotions, powders or perfumes, deodorant             Do not wear nail polish.  Do not shave  48 hours prior to surgery.              Men may shave face and neck.   Do not bring valuables to the hospital. [facility] IS NOT             RESPONSIBLE   FOR VALUABLES.  Contacts, dentures or bridgework may not be worn into surgery.  Leave suitcase in the car. After surgery it may be brought to your room.                 Please read over the following fact sheets you were given: _____________________________________________________________________             [facility] - Pre[facility] Before surgery, you can play an important role.  Because skin is not sterile, your skin needs to be as free of germs as possible.  You can reduce the number of germs on your skin by washing with CHG (chlorahexidine gluconate) soap before surgery.  CHG is an antiseptic cleaner which kills germs and bonds with the skin to continue killing germs even after washing. Please DO NOT use if you have an allergy to CHG or antibacterial soaps.  If your skin becomes reddened/irritated stop using the CHG and inform your nurse when you arrive at Short Stay. Do not shave (including legs and underarms) for at least  48 hours prior to the first CHG shower.  You may shave your face/neck. Please follow these instructions carefully:  1.  Shower with CHG Soap the night before surgery and the  morning of Surgery.  2.  If you choose to wash your hair, wash your hair first as usual with your  normal  shampoo.  3.  After you shampoo, rinse your hair and body thoroughly to remove the  shampoo.                           4.  Use CHG as you would any other liquid soap.  You can apply chg directly  to the skin and wash                       Gently with a scrungie or clean washcloth.  5.  Apply the CHG Soap to your body ONLY FROM THE NECK DOWN.   Do not use on face/ open                           Wound or open sores. Avoid contact with eyes, ears mouth and genitals (private parts).                       Wash face,  Genitals (private parts) with your normal soap.             6.  Wash thoroughly, paying special attention to the area where your surgery  will be performed.  7.  Thoroughly rinse your body with warm water from the neck down.  8.  DO NOT shower/wash with your normal soap after using and rinsing off  the CHG Soap.                9.  Pat yourself dry with a clean towel.            10.  Wear clean pajamas.            11.  Place clean sheets on your bed the night of your first shower and do not  sleep with pets. Day of Surgery : Do not apply any lotions/deodorants the morning of surgery.  Please wear clean clothes to the hospital/surgery center.  FAILURE TO FOLLOW THESE INSTRUCTIONS MAY RESULT IN THE CANCELLATION OF YOUR SURGERY PATIENT SIGNATURE_________________________________  NURSE SIGNATURE__________________________________  ________________________________________________________________________

## 2015-05-23 ENCOUNTER — Encounter (HOSPITAL_COMMUNITY)
Admission: RE | Admit: 2015-05-23 | Discharge: 2015-05-23 | Disposition: A | Discharge status: Home / Self Care | Payer: BLUE CROSS/BLUE SHIELD | Source: Ambulatory Visit | Attending: Surgery | Admitting: Surgery

## 2015-05-23 ENCOUNTER — Encounter (HOSPITAL_COMMUNITY): Payer: Self-pay

## 2015-05-23 DIAGNOSIS — K449 Diaphragmatic hernia without obstruction or gangrene: Secondary | ICD-10-CM | POA: Insufficient documentation

## 2015-05-23 DIAGNOSIS — R52 Pain, unspecified: Secondary | ICD-10-CM | POA: Diagnosis not present

## 2015-05-23 DIAGNOSIS — Z01812 Encounter for preprocedural laboratory examination: Secondary | ICD-10-CM | POA: Insufficient documentation

## 2015-05-23 DIAGNOSIS — R112 Nausea with vomiting, unspecified: Secondary | ICD-10-CM | POA: Insufficient documentation

## 2015-05-23 HISTORY — DX: Personal history of other diseases of the digestive system: Z87.19

## 2015-05-23 HISTORY — DX: Unspecified hearing loss, unspecified ear: H91.90

## 2015-05-23 LAB — CBC WITH DIFFERENTIAL/PLATELET
BASOS ABS: 0 10*3/uL (ref 0.0–0.1)
Basophils Relative: 0 %
EOS ABS: 0.1 10*3/uL (ref 0.0–0.7)
Eosinophils Relative: 2 %
HCT: 36.4 % (ref 36.0–46.0)
HEMOGLOBIN: 12 g/dL (ref 12.0–15.0)
LYMPHS ABS: 2.3 10*3/uL (ref 0.7–4.0)
LYMPHS PCT: 51 %
MCH: 29.2 pg (ref 26.0–34.0)
MCHC: 33 g/dL (ref 30.0–36.0)
MCV: 88.6 fL (ref 78.0–100.0)
Monocytes Absolute: 0.4 10*3/uL (ref 0.1–1.0)
Monocytes Relative: 8 %
Neutro Abs: 1.8 10*3/uL (ref 1.7–7.7)
Neutrophils Relative %: 39 %
Platelets: 258 10*3/uL (ref 150–400)
RBC: 4.11 MIL/uL (ref 3.87–5.11)
RDW: 13.2 % (ref 11.5–15.5)
WBC: 4.6 10*3/uL (ref 4.0–10.5)

## 2015-05-23 LAB — COMPREHENSIVE METABOLIC PANEL
ALK PHOS: 59 U/L (ref 38–126)
ALT: 13 U/L — AB (ref 14–54)
AST: 16 U/L (ref 15–41)
Albumin: 3.7 g/dL (ref 3.5–5.0)
Anion gap: 6 (ref 5–15)
BUN: 21 mg/dL — AB (ref 6–20)
CALCIUM: 9.1 mg/dL (ref 8.9–10.3)
CHLORIDE: 110 mmol/L (ref 101–111)
CO2: 28 mmol/L (ref 22–32)
CREATININE: 0.87 mg/dL (ref 0.44–1.00)
GFR calc non Af Amer: >60 mL/min (ref >60)
GLUCOSE: 88 mg/dL (ref 65–99)
Potassium: 4.1 mmol/L (ref 3.5–5.1)
SODIUM: 144 mmol/L (ref 135–145)
Total Bilirubin: 0.3 mg/dL (ref 0.3–1.2)
Total Protein: 6.4 g/dL — ABNORMAL LOW (ref 6.5–8.1)

## 2015-05-23 LAB — PREALBUMIN: PREALBUMIN: 25.8 mg/dL (ref 18–38)

## 2015-05-24 NOTE — Progress Notes (Deleted)
No note

## 2015-05-29 ENCOUNTER — Encounter (HOSPITAL_COMMUNITY): Payer: Self-pay | Admitting: *Deleted

## 2015-05-29 ENCOUNTER — Observation Stay (HOSPITAL_COMMUNITY)
Admission: RE | Admit: 2015-05-29 | Discharge: 2015-05-31 | Disposition: A | Discharge status: Home / Self Care | Payer: BLUE CROSS/BLUE SHIELD | Source: Ambulatory Visit | Attending: Surgery | Admitting: Surgery

## 2015-05-29 ENCOUNTER — Ambulatory Visit (HOSPITAL_COMMUNITY): Payer: BLUE CROSS/BLUE SHIELD | Admitting: Registered Nurse

## 2015-05-29 ENCOUNTER — Encounter (HOSPITAL_COMMUNITY)
Admission: RE | Disposition: A | Discharge status: Home / Self Care | Payer: Self-pay | Source: Ambulatory Visit | Attending: Surgery

## 2015-05-29 DIAGNOSIS — F419 Anxiety disorder, unspecified: Secondary | ICD-10-CM | POA: Insufficient documentation

## 2015-05-29 DIAGNOSIS — Z79891 Long term (current) use of opiate analgesic: Secondary | ICD-10-CM | POA: Insufficient documentation

## 2015-05-29 DIAGNOSIS — K440 Diaphragmatic hernia with obstruction, without gangrene: Principal | ICD-10-CM | POA: Insufficient documentation

## 2015-05-29 DIAGNOSIS — G8929 Other chronic pain: Secondary | ICD-10-CM | POA: Insufficient documentation

## 2015-05-29 DIAGNOSIS — E7800 Pure hypercholesterolemia, unspecified: Secondary | ICD-10-CM | POA: Insufficient documentation

## 2015-05-29 DIAGNOSIS — Z9889 Other specified postprocedural states: Secondary | ICD-10-CM

## 2015-05-29 DIAGNOSIS — Z79899 Other long term (current) drug therapy: Secondary | ICD-10-CM | POA: Diagnosis not present

## 2015-05-29 DIAGNOSIS — K219 Gastro-esophageal reflux disease without esophagitis: Secondary | ICD-10-CM | POA: Diagnosis not present

## 2015-05-29 DIAGNOSIS — F329 Major depressive disorder, single episode, unspecified: Secondary | ICD-10-CM | POA: Insufficient documentation

## 2015-05-29 DIAGNOSIS — K449 Diaphragmatic hernia without obstruction or gangrene: Secondary | ICD-10-CM | POA: Diagnosis present

## 2015-05-29 DIAGNOSIS — R0689 Other abnormalities of breathing: Secondary | ICD-10-CM

## 2015-05-29 DIAGNOSIS — R0600 Dyspnea, unspecified: Secondary | ICD-10-CM | POA: Diagnosis present

## 2015-05-29 HISTORY — PX: LAPAROSCOPIC NISSEN FUNDOPLICATION: SHX1932

## 2015-05-29 HISTORY — DX: Calculus of bile duct with chronic cholecystitis without obstruction: K80.44

## 2015-05-29 SURGERY — FUNDOPLICATION, NISSEN, ROBOT-ASSISTED, LAPAROSCOPIC
Anesthesia: General | Site: Abdomen

## 2015-05-29 MED ORDER — METRONIDAZOLE IN NACL 5-0.79 MG/ML-% IV SOLN
INTRAVENOUS | Status: AC
  Filled 2015-05-29: qty 100

## 2015-05-29 MED ORDER — METHOCARBAMOL 1000 MG/10ML IJ SOLN
1000.0000 mg | Freq: Four times a day (QID) | INTRAVENOUS | Status: DC | PRN
  Filled 2015-05-29 (×2): qty 10

## 2015-05-29 MED ORDER — ACETAMINOPHEN 325 MG PO TABS: 650.0000 mg | ORAL_TABLET | Freq: Four times a day (QID) | ORAL | Status: DC | PRN

## 2015-05-29 MED ORDER — PROPOFOL 10 MG/ML IV BOLUS
INTRAVENOUS | Status: DC | PRN
  Administered 2015-05-29: 30 mg via INTRAVENOUS
  Administered 2015-05-29: 110 mg via INTRAVENOUS

## 2015-05-29 MED ORDER — HYDROCODONE-ACETAMINOPHEN 7.5-325 MG PO TABS: 1.0000 | ORAL_TABLET | ORAL | Status: DC | PRN

## 2015-05-29 MED ORDER — METOCLOPRAMIDE HCL 5 MG/ML IJ SOLN: 5.0000 mg | Freq: Four times a day (QID) | INTRAMUSCULAR | Status: DC | PRN

## 2015-05-29 MED ORDER — GLYCOPYRROLATE 0.2 MG/ML IJ SOLN
INTRAMUSCULAR | Status: AC
  Filled 2015-05-29: qty 1

## 2015-05-29 MED ORDER — SUGAMMADEX SODIUM 200 MG/2ML IV SOLN
INTRAVENOUS | Status: AC
  Filled 2015-05-29: qty 2

## 2015-05-29 MED ORDER — SUCCINYLCHOLINE CHLORIDE 20 MG/ML IJ SOLN
INTRAMUSCULAR | Status: DC | PRN
  Administered 2015-05-29: 80 mg via INTRAVENOUS

## 2015-05-29 MED ORDER — SODIUM CHLORIDE 0.9 % IV SOLN: 250.0000 mL | INTRAVENOUS | Status: DC | PRN

## 2015-05-29 MED ORDER — NAPHAZOLINE-GLYCERIN 0.012-0.2 % OP SOLN: 1.0000 [drp] | Freq: Three times a day (TID) | OPHTHALMIC | Status: DC | PRN

## 2015-05-29 MED ORDER — ROCURONIUM BROMIDE 50 MG/5ML IV SOLN
INTRAVENOUS | Status: AC
  Filled 2015-05-29: qty 2

## 2015-05-29 MED ORDER — BUPIVACAINE-EPINEPHRINE (PF) 0.25% -1:200000 IJ SOLN
INTRAMUSCULAR | Status: AC
  Filled 2015-05-29: qty 30

## 2015-05-29 MED ORDER — LACTATED RINGERS IV SOLN: INTRAVENOUS | Status: DC

## 2015-05-29 MED ORDER — SODIUM CHLORIDE 0.9% FLUSH: 3.0000 mL | Freq: Two times a day (BID) | INTRAVENOUS | Status: DC

## 2015-05-29 MED ORDER — SUFENTANIL CITRATE 50 MCG/ML IV SOLN
INTRAVENOUS | Status: DC | PRN
  Administered 2015-05-29 (×3): 10 ug via INTRAVENOUS
  Administered 2015-05-29 (×3): 5 ug via INTRAVENOUS

## 2015-05-29 MED ORDER — DEXAMETHASONE SODIUM PHOSPHATE 10 MG/ML IJ SOLN
INTRAMUSCULAR | Status: AC
  Filled 2015-05-29: qty 2

## 2015-05-29 MED ORDER — PHENYLEPHRINE HCL 10 MG/ML IJ SOLN
10.0000 mg | INTRAVENOUS | Status: DC | PRN
  Administered 2015-05-29: 15 ug/min via INTRAVENOUS

## 2015-05-29 MED ORDER — METRONIDAZOLE IN NACL 5-0.79 MG/ML-% IV SOLN
500.0000 mg | INTRAVENOUS | Status: AC
  Administered 2015-05-29: 500 mg via INTRAVENOUS

## 2015-05-29 MED ORDER — DEXAMETHASONE SODIUM PHOSPHATE 10 MG/ML IJ SOLN
INTRAMUSCULAR | Status: DC | PRN
Start: 2015-05-29 — End: 2015-05-29
  Administered 2015-05-29: 10 mg via INTRAVENOUS

## 2015-05-29 MED ORDER — ROCURONIUM BROMIDE 100 MG/10ML IV SOLN
INTRAVENOUS | Status: DC | PRN
  Administered 2015-05-29 (×2): 10 mg via INTRAVENOUS
  Administered 2015-05-29: 50 mg via INTRAVENOUS
  Administered 2015-05-29: 10 mg via INTRAVENOUS

## 2015-05-29 MED ORDER — ACETAMINOPHEN 650 MG RE SUPP: 650.0000 mg | Freq: Four times a day (QID) | RECTAL | Status: DC | PRN

## 2015-05-29 MED ORDER — FENTANYL CITRATE (PF) 100 MCG/2ML IJ SOLN
INTRAMUSCULAR | Status: AC
  Filled 2015-05-29: qty 2

## 2015-05-29 MED ORDER — MEPERIDINE HCL 50 MG/ML IJ SOLN: 6.2500 mg | INTRAMUSCULAR | Status: DC | PRN

## 2015-05-29 MED ORDER — PROMETHAZINE HCL 12.5 MG PO TABS: 6.2500 mg | ORAL_TABLET | Freq: Four times a day (QID) | ORAL | Status: AC | PRN

## 2015-05-29 MED ORDER — MIDAZOLAM HCL 2 MG/2ML IJ SOLN
INTRAMUSCULAR | Status: AC
  Filled 2015-05-29: qty 2

## 2015-05-29 MED ORDER — DEXAMETHASONE SODIUM PHOSPHATE 4 MG/ML IJ SOLN
4.0000 mg | Freq: Two times a day (BID) | INTRAMUSCULAR | Status: DC
  Administered 2015-05-29: 4 mg via INTRAVENOUS
  Filled 2015-05-29 (×3): qty 1

## 2015-05-29 MED ORDER — LACTATED RINGERS IV SOLN
INTRAVENOUS | Status: DC | PRN
  Administered 2015-05-29 (×3): via INTRAVENOUS

## 2015-05-29 MED ORDER — CHLORPROMAZINE HCL 25 MG/ML IJ SOLN
25.0000 mg | Freq: Four times a day (QID) | INTRAMUSCULAR | Status: DC | PRN
  Filled 2015-05-29: qty 1

## 2015-05-29 MED ORDER — HYDROMORPHONE HCL 1 MG/ML IJ SOLN
0.5000 mg | INTRAMUSCULAR | Status: DC | PRN
  Administered 2015-05-29 (×2): 2 mg via INTRAVENOUS
  Administered 2015-05-29 – 2015-05-30 (×3): 1 mg via INTRAVENOUS
  Administered 2015-05-30: 2 mg via INTRAVENOUS
  Filled 2015-05-29 (×2): qty 1
  Filled 2015-05-29: qty 2
  Filled 2015-05-29: qty 1
  Filled 2015-05-29 (×2): qty 2

## 2015-05-29 MED ORDER — LACTATED RINGERS IV SOLN
INTRAVENOUS | Status: DC
  Administered 2015-05-29: 17:00:00 via INTRAVENOUS

## 2015-05-29 MED ORDER — CEFAZOLIN SODIUM-DEXTROSE 2-4 GM/100ML-% IV SOLN
INTRAVENOUS | Status: AC
  Filled 2015-05-29: qty 100

## 2015-05-29 MED ORDER — POLYETHYLENE GLYCOL 3350 17 G PO PACK
17.0000 g | PACK | Freq: Every day | ORAL | Status: DC
  Administered 2015-05-30: 17 g via ORAL
  Filled 2015-05-29 (×2): qty 1

## 2015-05-29 MED ORDER — SUGAMMADEX SODIUM 200 MG/2ML IV SOLN
INTRAVENOUS | Status: DC | PRN
  Administered 2015-05-29: 100 mg via INTRAVENOUS

## 2015-05-29 MED ORDER — HYDROMORPHONE HCL 1 MG/ML IJ SOLN
INTRAMUSCULAR | Status: DC | PRN
  Administered 2015-05-29 (×2): 0.5 mg via INTRAVENOUS

## 2015-05-29 MED ORDER — CLEAR EYES COMPLETE OP SOLN: 1.0000 [drp] | Freq: Three times a day (TID) | OPHTHALMIC | Status: DC | PRN

## 2015-05-29 MED ORDER — LACTATED RINGERS IR SOLN
Status: DC | PRN
  Administered 2015-05-29: 1000 mL

## 2015-05-29 MED ORDER — ONDANSETRON 4 MG PO TBDP
4.0000 mg | ORAL_TABLET | Freq: Four times a day (QID) | ORAL | Status: DC | PRN
  Administered 2015-05-31: 4 mg via ORAL
  Filled 2015-05-29: qty 1

## 2015-05-29 MED ORDER — VITAMIN C 500 MG PO TABS
500.0000 mg | ORAL_TABLET | Freq: Two times a day (BID) | ORAL | Status: DC
  Administered 2015-05-29: 500 mg via ORAL
  Filled 2015-05-29 (×3): qty 1

## 2015-05-29 MED ORDER — PHENYLEPHRINE HCL 10 MG/ML IJ SOLN
INTRAMUSCULAR | Status: AC
  Filled 2015-05-29: qty 1

## 2015-05-29 MED ORDER — SODIUM CHLORIDE 0.9% FLUSH: 3.0000 mL | INTRAVENOUS | Status: DC | PRN

## 2015-05-29 MED ORDER — FENTANYL CITRATE (PF) 100 MCG/2ML IJ SOLN
25.0000 ug | INTRAMUSCULAR | Status: DC | PRN
  Administered 2015-05-29: 25 ug via INTRAVENOUS
  Administered 2015-05-29: 50 ug via INTRAVENOUS

## 2015-05-29 MED ORDER — ALBUTEROL SULFATE (2.5 MG/3ML) 0.083% IN NEBU: 2.5000 mg | INHALATION_SOLUTION | Freq: Four times a day (QID) | RESPIRATORY_TRACT | Status: DC | PRN

## 2015-05-29 MED ORDER — MIDAZOLAM HCL 5 MG/5ML IJ SOLN
INTRAMUSCULAR | Status: DC | PRN
  Administered 2015-05-29 (×3): 1 mg via INTRAVENOUS

## 2015-05-29 MED ORDER — SUFENTANIL CITRATE 50 MCG/ML IV SOLN
INTRAVENOUS | Status: AC
  Filled 2015-05-29: qty 1

## 2015-05-29 MED ORDER — DIPHENHYDRAMINE HCL 50 MG/ML IJ SOLN
12.5000 mg | Freq: Four times a day (QID) | INTRAMUSCULAR | Status: DC | PRN
Start: 2015-05-29 — End: 2015-05-31

## 2015-05-29 MED ORDER — LIDOCAINE HCL (CARDIAC) 20 MG/ML IV SOLN
INTRAVENOUS | Status: AC
  Filled 2015-05-29: qty 10

## 2015-05-29 MED ORDER — METHOCARBAMOL 500 MG PO TABS: 500.0000 mg | ORAL_TABLET | Freq: Four times a day (QID) | ORAL | Status: DC | PRN

## 2015-05-29 MED ORDER — PROPOFOL 10 MG/ML IV BOLUS
INTRAVENOUS | Status: AC
  Filled 2015-05-29: qty 20

## 2015-05-29 MED ORDER — BUPROPION HCL ER (XL) 300 MG PO TB24
450.0000 mg | ORAL_TABLET | Freq: Every morning | ORAL | Status: DC
  Filled 2015-05-29 (×2): qty 1

## 2015-05-29 MED ORDER — GLYCOPYRROLATE 0.2 MG/ML IJ SOLN
INTRAMUSCULAR | Status: DC | PRN
  Administered 2015-05-29: 0.2 mg via INTRAVENOUS

## 2015-05-29 MED ORDER — ASPIRIN 325 MG PO TABS
325.0000 mg | ORAL_TABLET | Freq: Four times a day (QID) | ORAL | Status: DC | PRN
  Filled 2015-05-29: qty 1

## 2015-05-29 MED ORDER — PROCHLORPERAZINE EDISYLATE 5 MG/ML IJ SOLN
5.0000 mg | INTRAMUSCULAR | Status: DC | PRN
  Administered 2015-05-29: 10 mg via INTRAVENOUS
  Filled 2015-05-29: qty 2

## 2015-05-29 MED ORDER — ONDANSETRON HCL 4 MG/2ML IJ SOLN
4.0000 mg | Freq: Four times a day (QID) | INTRAMUSCULAR | Status: DC | PRN
  Administered 2015-05-29: 4 mg via INTRAVENOUS
  Filled 2015-05-29: qty 2

## 2015-05-29 MED ORDER — BUPIVACAINE-EPINEPHRINE 0.25% -1:200000 IJ SOLN
INTRAMUSCULAR | Status: DC | PRN
  Administered 2015-05-29: 50 mL

## 2015-05-29 MED ORDER — ESTRADIOL 1 MG PO TABS
0.5000 mg | ORAL_TABLET | Freq: Every day | ORAL | Status: DC
  Administered 2015-05-30: 0.5 mg via ORAL
  Filled 2015-05-29 (×2): qty 0.5

## 2015-05-29 MED ORDER — ATROPINE SULFATE 0.4 MG/ML IJ SOLN
INTRAMUSCULAR | Status: AC
  Filled 2015-05-29: qty 1

## 2015-05-29 MED ORDER — HYDROMORPHONE HCL 2 MG/ML IJ SOLN
INTRAMUSCULAR | Status: AC
  Filled 2015-05-29: qty 1

## 2015-05-29 MED ORDER — MIDAZOLAM HCL 2 MG/2ML IJ SOLN
INTRAMUSCULAR | Status: AC
  Filled 2015-05-29: qty 4

## 2015-05-29 MED ORDER — ATROPINE SULFATE 0.4 MG/ML IJ SOLN
INTRAMUSCULAR | Status: DC | PRN
  Administered 2015-05-29: 0.4 mg via INTRAVENOUS

## 2015-05-29 MED ORDER — ONDANSETRON HCL 4 MG/2ML IJ SOLN
INTRAMUSCULAR | Status: DC | PRN
  Administered 2015-05-29: 4 mg via INTRAVENOUS

## 2015-05-29 MED ORDER — ALPRAZOLAM 0.25 MG PO TABS
0.2500 mg | ORAL_TABLET | Freq: Three times a day (TID) | ORAL | Status: DC | PRN
  Filled 2015-05-29: qty 1

## 2015-05-29 MED ORDER — MAGIC MOUTHWASH
15.0000 mL | Freq: Four times a day (QID) | ORAL | Status: DC | PRN
  Filled 2015-05-29: qty 15

## 2015-05-29 MED ORDER — DIPHENHYDRAMINE HCL 12.5 MG/5ML PO ELIX: 12.5000 mg | ORAL_SOLUTION | Freq: Four times a day (QID) | ORAL | Status: DC | PRN

## 2015-05-29 MED ORDER — SODIUM CHLORIDE 0.9 % IJ SOLN
INTRAMUSCULAR | Status: AC
  Filled 2015-05-29: qty 10

## 2015-05-29 MED ORDER — HYDRALAZINE HCL 20 MG/ML IJ SOLN: 10.0000 mg | INTRAMUSCULAR | Status: DC | PRN

## 2015-05-29 MED ORDER — EPHEDRINE SULFATE 50 MG/ML IJ SOLN
INTRAMUSCULAR | Status: AC
  Filled 2015-05-29: qty 1

## 2015-05-29 MED ORDER — ASPIRIN-CAFFEINE 500-32.5 MG PO TABS: 1.0000 | ORAL_TABLET | Freq: Four times a day (QID) | ORAL | Status: DC | PRN

## 2015-05-29 MED ORDER — 0.9 % SODIUM CHLORIDE (POUR BTL) OPTIME
TOPICAL | Status: DC | PRN
  Administered 2015-05-29: 1000 mL

## 2015-05-29 MED ORDER — PHENOL 1.4 % MT LIQD: 2.0000 | OROMUCOSAL | Status: DC | PRN

## 2015-05-29 MED ORDER — ENOXAPARIN SODIUM 30 MG/0.3ML ~~LOC~~ SOLN
30.0000 mg | SUBCUTANEOUS | Status: DC
  Administered 2015-05-30 – 2015-05-31 (×2): 30 mg via SUBCUTANEOUS
  Filled 2015-05-29 (×3): qty 0.3

## 2015-05-29 MED ORDER — DEXTROSE 5 % IV SOLN
2.0000 g | INTRAVENOUS | Status: AC
  Administered 2015-05-29: 2 g via INTRAVENOUS
  Filled 2015-05-29: qty 20

## 2015-05-29 MED ORDER — EPHEDRINE SULFATE 50 MG/ML IJ SOLN
INTRAMUSCULAR | Status: DC | PRN
  Administered 2015-05-29: 5 mg via INTRAVENOUS

## 2015-05-29 MED ORDER — LACTATED RINGERS IV BOLUS (SEPSIS): 1000.0000 mL | Freq: Three times a day (TID) | INTRAVENOUS | Status: DC | PRN

## 2015-05-29 MED ORDER — DEXTROSE 5 % IV SOLN: 1000.0000 mg | Freq: Four times a day (QID) | INTRAVENOUS | Status: DC | PRN

## 2015-05-29 MED ORDER — LIP MEDEX EX OINT
1.0000 "application " | TOPICAL_OINTMENT | Freq: Two times a day (BID) | CUTANEOUS | Status: DC
  Administered 2015-05-29 – 2015-05-30 (×4): 1 via TOPICAL
  Filled 2015-05-29 (×2): qty 7

## 2015-05-29 MED ORDER — LIDOCAINE HCL (CARDIAC) 20 MG/ML IV SOLN
INTRAVENOUS | Status: DC | PRN
  Administered 2015-05-29: 50 mg via INTRAVENOUS
  Administered 2015-05-29: 25 mg via INTRATRACHEAL

## 2015-05-29 MED ORDER — MENTHOL 3 MG MT LOZG: 1.0000 | LOZENGE | OROMUCOSAL | Status: DC | PRN

## 2015-05-29 MED ORDER — METOCLOPRAMIDE HCL 5 MG/ML IJ SOLN: 10.0000 mg | Freq: Once | INTRAMUSCULAR | Status: DC | PRN

## 2015-05-29 MED ORDER — POLYETHYLENE GLYCOL 3350 17 G PO PACK: 17.0000 g | PACK | Freq: Two times a day (BID) | ORAL | Status: DC | PRN

## 2015-05-29 MED ORDER — ONDANSETRON HCL 4 MG/2ML IJ SOLN
INTRAMUSCULAR | Status: AC
  Filled 2015-05-29: qty 2

## 2015-05-29 SURGICAL SUPPLY — 60 items
APPLIER CLIP 5 13 M/L LIGAMAX5 (MISCELLANEOUS)
APPLIER CLIP ROT 10 11.4 M/L (STAPLE)
APR CLP MED LRG 11.4X10 (STAPLE)
APR CLP MED LRG 5 ANG JAW (MISCELLANEOUS)
BLADE SURG SZ11 CARB STEEL (BLADE) ×3 IMPLANT
CHLORAPREP W/TINT 26ML (MISCELLANEOUS) ×3 IMPLANT
CLIP APPLIE 5 13 M/L LIGAMAX5 (MISCELLANEOUS) IMPLANT
CLIP APPLIE ROT 10 11.4 M/L (STAPLE) IMPLANT
COVER SURGICAL LIGHT HANDLE (MISCELLANEOUS) ×1 IMPLANT
COVER TIP SHEARS 8 DVNC (MISCELLANEOUS) ×1 IMPLANT
COVER TIP SHEARS 8MM DA VINCI (MISCELLANEOUS) ×2
DEVICE TROCAR PUNCTURE CLOSURE (ENDOMECHANICALS) IMPLANT
DRAIN CHANNEL 19F RND (DRAIN) ×2 IMPLANT
DRAIN PENROSE 18X1/2 LTX STRL (DRAIN) IMPLANT
DRAPE ARM DVNC X/XI (DISPOSABLE) ×4 IMPLANT
DRAPE COLUMN DVNC XI (DISPOSABLE) ×1 IMPLANT
DRAPE DA VINCI XI ARM (DISPOSABLE) ×8
DRAPE DA VINCI XI COLUMN (DISPOSABLE) ×2
DRSG TEGADERM 2-3/8X2-3/4 SM (GAUZE/BANDAGES/DRESSINGS) ×16 IMPLANT
DRSG TEGADERM 4X4.75 (GAUZE/BANDAGES/DRESSINGS) ×2 IMPLANT
ELECT REM PT RETURN 9FT ADLT (ELECTROSURGICAL) ×3
ELECTRODE REM PT RTRN 9FT ADLT (ELECTROSURGICAL) ×1 IMPLANT
ENDOLOOP SUT PDS II  0 18 (SUTURE)
ENDOLOOP SUT PDS II 0 18 (SUTURE) IMPLANT
FELT TEFLON 4 X1 (Mesh General) ×2 IMPLANT
GAUZE SPONGE 2X2 8PLY STRL LF (GAUZE/BANDAGES/DRESSINGS) ×1 IMPLANT
GLOVE ECLIPSE 8.0 STRL XLNG CF (GLOVE) ×6 IMPLANT
GLOVE INDICATOR 8.0 STRL GRN (GLOVE) ×6 IMPLANT
GOWN STRL REUS W/TWL XL LVL3 (GOWN DISPOSABLE) ×15 IMPLANT
KIT BASIN OR (CUSTOM PROCEDURE TRAY) ×3 IMPLANT
MARKER SKIN DUAL TIP RULER LAB (MISCELLANEOUS) ×3 IMPLANT
MESH SURGISIS HERNIA ×2 IMPLANT
NDL INSUFFLATION 14GA 120MM (NEEDLE) ×1 IMPLANT
NEEDLE HYPO 22GX1.5 SAFETY (NEEDLE) ×3 IMPLANT
NEEDLE INSUFFLATION 14GA 120MM (NEEDLE) ×3 IMPLANT
PACK CARDIOVASCULAR III (CUSTOM PROCEDURE TRAY) ×3 IMPLANT
PAD POSITIONING PINK XL (MISCELLANEOUS) ×3 IMPLANT
SCISSORS LAP 5X35 DISP (ENDOMECHANICALS) ×3 IMPLANT
SEAL CANN UNIV 5-8 DVNC XI (MISCELLANEOUS) ×3 IMPLANT
SEAL XI 5MM-8MM UNIVERSAL (MISCELLANEOUS) ×6
SEALER VESSEL DA VINCI XI (MISCELLANEOUS) ×2
SEALER VESSEL EXT DVNC XI (MISCELLANEOUS) ×1 IMPLANT
SET BI-LUMEN FLTR TB AIRSEAL (TUBING) ×3 IMPLANT
SLEEVE XCEL OPT CAN 5 100 (ENDOMECHANICALS) IMPLANT
SOLUTION ANTI FOG 6CC (MISCELLANEOUS) ×2 IMPLANT
SOLUTION ELECTROLUBE (MISCELLANEOUS) ×3 IMPLANT
SPONGE GAUZE 2X2 STER 10/PKG (GAUZE/BANDAGES/DRESSINGS) ×4
SPONGE LAP 18X18 X RAY DECT (DISPOSABLE) ×3 IMPLANT
SUT ETHIBOND 0 36 GRN (SUTURE) ×6 IMPLANT
SUT ETHIBOND NAB CT1 #1 30IN (SUTURE) ×4 IMPLANT
SUT MNCRL AB 4-0 PS2 18 (SUTURE) ×3 IMPLANT
SUT PROLENE 2 0 SH DA (SUTURE) ×2 IMPLANT
SYR 20CC LL (SYRINGE) ×3 IMPLANT
SYR BULB IRRIGATION 50ML (SYRINGE) ×2 IMPLANT
SYRINGE 10CC LL (SYRINGE) ×3 IMPLANT
TOWEL OR 17X26 10 PK STRL BLUE (TOWEL DISPOSABLE) ×3 IMPLANT
TOWEL OR NON WOVEN STRL DISP B (DISPOSABLE) ×3 IMPLANT
TRAY FOLEY W/METER SILVER 14FR (SET/KITS/TRAYS/PACK) ×3 IMPLANT
TRAY FOLEY W/METER SILVER 16FR (SET/KITS/TRAYS/PACK) ×1 IMPLANT
TROCAR ADV FIXATION 5X100MM (TROCAR) ×2 IMPLANT

## 2015-05-29 NOTE — Anesthesia Postprocedure Evaluation (Signed)
Anesthesia Post Note  Patient: Higinio PlanFrances D Doverspike  Procedure(s) Performed: Procedure(s) (LRB): XI ROBOTIC HIATAL HERNIA  WITH  MESH AND NISSEN FUNDOPLICATION (N/A)  Patient location during evaluation: PACU Anesthesia Type: General Level of consciousness: awake and alert Pain management: pain level controlled Vital Signs Assessment: post-procedure vital signs reviewed and stable Respiratory status: spontaneous breathing, nonlabored ventilation, respiratory function stable and patient connected to nasal cannula oxygen Cardiovascular status: blood pressure returned to baseline and stable Postop Assessment: no signs of nausea or vomiting Anesthetic complications: no    Last Vitals:  Filed Vitals:   05/29/15 1330 05/29/15 1359  BP: 153/88 133/87  Pulse: 88 82  Temp: 36.3 C 36 C  Resp: 17     Last Pain:  Filed Vitals:   05/29/15 1401  PainSc: Asleep                 Phillips Groutarignan, Nefertari Rebman

## 2015-05-29 NOTE — Discharge Instructions (Signed)
EATING AFTER YOUR ESOPHAGEAL SURGERY (Stomach Fundoplication, Hiatal Hernia repair, Achalasia surgery, etc)  After your esophageal surgery, expect some sticking with swallowing over the next 1-2 months.    If food sticks when you eat, it is called "dysphagia".  This is due to swelling around your esophagus at the wrap & hiatal diaphragm repair.  It will gradually ease off over the next few months.  To help you through this temporary phase, we start you out on a pureed (blenderized) diet.  Your first meal in the hospital was thin liquids.  You should have been given a pureed diet by the time you left the hospital.  We ask patients to stay on a pureed diet for the first 2-3 weeks to avoid anything getting "stuck" near your recent surgery.  Don't be alarmed if your ability to swallow doesn't progress according to this plan.  Everyone is different and some diets can advance more or less quickly.     Some BASIC RULES to follow are:  Maintain an upright position whenever eating or drinking.  Take small bites - just a teaspoon size bite at a time.  Eat slowly.  It may also help to eat only one food at a time.  Consider nibbling through smaller, more frequent meals & avoid the urge to eat BIG meals  Do not push through feelings of fullness, nausea, or bloatedness  Do not mix solid foods and liquids in the same mouthful  Try not to "wash foods down" with large gulps of liquids.  Avoid carbonated (bubbly/fizzy) drinks.    Avoid foods that make you feel gassy or bloated.  Start with bland foods first.  Wait on trying greasy, fried, or spicy meals until you are tolerating more bland solids well.  Understand that it will be hard to burp and belch at first.  This gradually improves with time.  Expect to be more gassy/flatulent/bloated initially.  Walking will help your body manage it better.  Consider using medications for bloating that contain simethicone such as  Maalox or Gas-X   Eat in a  relaxed atmosphere & minimize distractions.  Avoid talking while eating.    Do not use straws.  Following each meal, sit in an upright position (90 degree angle) for 60 to 90 minutes.  Going for a short walk can help as well  If food does stick, don't panic.  Try to relax and let the food pass on its own.  Sipping WARM LIQUID such as strong hot black tea can also help slide it down.   Be gradual in changes & use common sense:  -If you easily tolerating a certain "level" of foods, advance to the next level gradually -If you are having trouble swallowing a particular food, then avoid it.   -If food is sticking when you advance your diet, go back to thinner previous diet (the lower LEVEL) for 1-2 days.  LEVEL 1 = PUREED DIET  Do for the first 2 WEEKS AFTER SURGERY  -Foods in this group are pureed or blenderized to a smooth, mashed potato-like consistency.  -If necessary, the pureed foods can keep their shape with the addition of a thickening agent.   -Meat should be pureed to a smooth, pasty consistency.  Hot broth or gravy may be added to the pureed meat, approximately 1 oz. of liquid per 3 oz. serving of meat. -CAUTION:  If any foods do not puree into a smooth consistency, swallowing will be more difficult.  (For example, nuts  or seeds sometimes do not blend well.)  Hot Foods Cold Foods  Pureed scrambled eggs and cheese Pureed cottage cheese  Baby cereals Thickened juices and nectars  Thinned cooked cereals (no lumps) Thickened milk or eggnog  Pureed [place] toast or pancakes Ensure  Mashed potatoes Ice cream  Pureed parsley, au gratin, scalloped potatoes, candied sweet potatoes Fruit or [place] ice, sherbet  Pureed buttered or alfredo noodles Plain yogurt  Pureed vegetables (no corn or peas) Instant breakfast  Pureed soups and creamed soups Smooth pudding, mousse, custard  Pureed scalloped apples Whipped gelatin  Gravies Sugar, syrup, honey, jelly  Sauces, cheese, tomato,  barbecue, white, creamed Cream  Any baby food Creamer  Alcohol in moderation (not beer or champagne) Margarine  Coffee or tea Mayonnaise   Ketchup, mustard   Apple sauce   SAMPLE MENU:  PUREED DIET Breakfast Lunch Dinner   Orange juice, 1/2 cup  Cream of wheat, 1/2 cup  Pineapple juice, 1/2 cup  Pureed [place], barley soup, 3/4 cup  Pureed Hawaiian chicken, 3 oz   Scrambled eggs, mashed or blended with cheese, 1/2 cup  Tea or coffee, 1 cup   Whole milk, 1 cup   Non-dairy creamer, 2 Tbsp.  Mashed potatoes, 1/2 cup  Pureed cooled broccoli, 1/2 cup  Apple sauce, 1/2 cup  Coffee or tea  Mashed potatoes, 1/2 cup  Pureed spinach, 1/2 cup  Frozen yogurt, 1/2 cup  Tea or coffee      LEVEL 2 = SOFT DIET  After your first 2 weeks, you can advance to a soft diet.   Keep on this diet until everything goes down easily.  Hot Foods Cold Foods  White fish Cottage cheese  Stuffed fish Junior baby fruit  Baby food meals Semi thickened juices  Minced soft cooked, scrambled, poached eggs nectars  Souffle & omelets Ripe mashed bananas  Cooked cereals Canned fruit, pineapple sauce, milk  potatoes Milkshake  Buttered or Alfredo noodles Custard  Cooked cooled vegetable Puddings, including tapioca  Sherbet Yogurt  Vegetable soup or alphabet soup Fruit ice, [place] ice  Gravies Whipped gelatin  Sugar, syrup, honey, jelly Junior baby desserts  Sauces:  Cheese, creamed, barbecue, tomato, white Cream  Coffee or tea Margarine   SAMPLE MENU:  LEVEL 2 Breakfast Lunch Dinner   Orange juice, 1/2 cup  Oatmeal, 1/2 cup  Scrambled eggs with cheese, 1/2 cup  Decaffeinated tea, 1 cup  Whole milk, 1 cup  Non-dairy creamer, 2 Tbsp  Pineapple juice, 1/2 cup  Minced beef, 3 oz  Gravy, 2 Tbsp  Mashed potatoes, 1/2 cup  Minced fresh broccoli, 1/2 cup  Applesauce, 1/2 cup  Coffee, 1 cup  [place], barley soup, 3/4 cup  Minced Hawaiian chicken, 3 oz  Mashed potatoes, 1/2  cup  Cooked spinach, 1/2 cup  Frozen yogurt, 1/2 cup  Non-dairy creamer, 2 Tbsp      LEVEL 3 = CHOPPED DIET  -After all the foods in level 2 (soft diet) are passing through well you should advance up to more chopped foods.  -It is still important to cut these foods into small pieces and eat slowly.  Hot Foods Cold Foods  Poultry Cottage cheese  Chopped Swedish meatballs Yogurt  Meat salads (ground or flaked meat) Milk  Flaked fish (tuna) Milkshakes  Poached or scrambled eggs Soft, cold, dry cereal  Souffles and omelets Fruit juices or nectars  Cooked cereals Chopped canned fruit  Chopped [place] toast or pancakes Canned fruit cocktail  Noodles or  pasta (no rice) Pudding, mousse, custard  Cooked vegetables (no frozen peas, corn, or mixed vegetables) Green salad  Canned small sweet peas Ice cream  Creamed soup or vegetable soup Fruit ice, [place] ice  Pureed vegetable soup or alphabet soup Non-dairy creamer  Ground scalloped apples Margarine  Gravies Mayonnaise  Sauces:  Cheese, creamed, barbecue, tomato, white Ketchup  Coffee or tea Mustard   SAMPLE MENU:  LEVEL 3 Breakfast Lunch Dinner   Orange juice, 1/2 cup  Oatmeal, 1/2 cup  Scrambled eggs with cheese, 1/2 cup  Decaffeinated tea, 1 cup  Whole milk, 1 cup  Non-dairy creamer, 2 Tbsp  Ketchup, 1 Tbsp  Margarine, 1 tsp  Salt, 1/4 tsp  Sugar, 2 tsp  Pineapple juice, 1/2 cup  Ground beef, 3 oz  Gravy, 2 Tbsp  Mashed potatoes, 1/2 cup  Cooked spinach, 1/2 cup  Applesauce, 1/2 cup  Decaffeinated coffee  Whole milk  Non-dairy creamer, 2 Tbsp  Margarine, 1 tsp  Salt, 1/4 tsp  Pureed [place], barley soup, 3/4 cup  Barbecue chicken, 3 oz  Mashed potatoes, 1/2 cup  Ground fresh broccoli, 1/2 cup  Frozen yogurt, 1/2 cup  Decaffeinated tea, 1 cup  Non-dairy creamer, 2 Tbsp  Margarine, 1 tsp  Salt, 1/4 tsp  Sugar, 1 tsp    LEVEL 4:  REGULAR FOODS  -Foods in this group are soft,  moist, regularly textured foods.   -This level includes meat and breads, which tend to be the hardest things to swallow.   -Eat very slowly, chew well and continue to avoid carbonated drinks. -most people are at this level in 4-6 weeks  Hot Foods Cold Foods  Baked fish or skinned Soft cheeses - cottage cheese  Souffles and omelets Cream cheese  Eggs Yogurt  Stuffed shells Milk  Spaghetti with meat sauce Milkshakes  Cooked cereal Cold dry cereals (no nuts, dried fruit, coconut)  [place] toast or pancakes Crackers  Buttered toast Fruit juices or nectars  Noodles or pasta (no rice) Canned fruit  Potatoes (all types) Ripe bananas  Soft, cooked vegetables (no corn, lima, or baked beans) Peeled, ripe, fresh fruit  Creamed soups or vegetable soup Cakes (no nuts, dried fruit, coconut)  Canned chicken noodle soup Plain doughnuts  Gravies Ice cream  Bacon dressing Pudding, mousse, custard  Sauces:  Cheese, creamed, barbecue, tomato, white Fruit ice, [place] ice, sherbet  Decaffeinated tea or coffee Whipped gelatin  Pork chops Regular gelatin   Canned fruited gelatin molds   Sugar, syrup, honey, jam, jelly   Cream   Non-dairy   Margarine   Oil   Mayonnaise   Ketchup   Mustard   TROUBLESHOOTING IRREGULAR BOWELS  1) Avoid extremes of bowel movements (no bad constipation/diarrhea)  2) Miralax 17gm mixed in 8oz. water or juice-daily. May use BID as needed.  3) Gas-x,Phazyme, etc. as needed for gas & bloating.  4) Soft,bland diet. No spicy,greasy,fried foods.  5) Prilosec over-the-counter as needed  6) May hold gluten/wheat products from diet to see if symptoms improve.  7) May try probiotics (Align, Activa, etc) to help calm the bowels down  7) If symptoms become worse call back immediately.    If you have any questions please call our office at [place]: [phone].  LAPAROSCOPIC SURGERY: POST OP INSTRUCTIONS  1. DIET: Follow a light bland diet the first 24 hours  after arrival home, such as soup, liquids, crackers, etc.  Be sure to include lots of fluids daily.  Avoid fast  food or heavy meals as your are more likely to get nauseated.  Eat a low fat the next few days after surgery.   2. Take your usually prescribed home medications unless otherwise directed. 3. PAIN CONTROL: a. Pain is best controlled by a usual combination of three different methods TOGETHER: i. Ice/Heat ii. Over the counter pain medication iii. Prescription pain medication b. Most patients will experience some swelling and bruising around the incisions.  Ice packs or heating pads (30-60 minutes up to 6 times a day) will help. Use ice for the first few days to help decrease swelling and bruising, then switch to heat to help relax tight/sore spots and speed recovery.  Some people prefer to use ice alone, heat alone, alternating between ice & heat.  Experiment to what works for you.  Swelling and bruising can take several weeks to resolve.   c. It is helpful to take an over-the-counter pain medication regularly for the first few weeks.  Choose one of the following that works best for you: i. Naproxen (Aleve, etc)  Two 220mg  tabs twice a day ii. Ibuprofen (Advil, etc) Three 200mg  tabs four times a day (every meal & bedtime) iii. Acetaminophen (Tylenol, etc) 500-650mg  four times a day (every meal & bedtime) d. A  prescription for pain medication (such as oxycodone, hydrocodone, etc) should be given to you upon discharge.  Take your pain medication as prescribed.  i. If you are having problems/concerns with the prescription medicine (does not control pain, nausea, vomiting, rash, itching, etc), please call us [phone] to see if we need to switch you to a different pain medicine that will work better for you and/or control your side effect better. ii. If you need a refill on your pain medication, please contact your pharmacy.  They will contact our office to request authorization. Prescriptions  will not be filled after 5 pm or on week-ends. 4. Avoid getting constipated.  Between the surgery and the pain medications, it is common to experience some constipation.  Increasing fluid intake and taking a fiber supplement (such as Metamucil, Citrucel, FiberCon, MiraLax, etc) 1-2 times a day regularly will usually help prevent this problem from occurring.  A mild laxative (prune juice, Milk of Magnesia, MiraLax, etc) should be taken according to package directions if there are no bowel movements after 48 hours.   5. Watch out for diarrhea.  If you have many loose bowel movements, simplify your diet to bland foods & liquids for a few days.  Stop any stool softeners and decrease your fiber supplement.  Switching to mild anti-diarrheal medications (Kayopectate, Pepto Bismol) can help.  If this worsens or does not improve, please call us. 6. Wash / shower every day.  You may shower over the dressings as they are waterproof.  Continue to shower over incision(s) after the dressing is off. 7. Remove your waterproof bandages 5 days after surgery.  You may leave the incision open to air.  You may replace a dressing/Band-Aid to cover the incision for comfort if you wish.  8. ACTIVITIES as tolerated:   a. You may resume regular (light) daily activities beginning the next day--such as daily self-care, walking, climbing stairs--gradually increasing activities as tolerated.  If you can walk 30 minutes without difficulty, it is safe to try more intense activity such as jogging, treadmill, bicycling, low-impact aerobics, swimming, etc. b. Save the most intensive and strenuous activity for last such as sit-ups, heavy lifting, contact sports, etc  Refrain from any  heavy lifting or straining until you are off narcotics for pain control.   c. DO NOT PUSH THROUGH PAIN.  Let pain be your guide: If it hurts to do something, don't do it.  Pain is your body warning you to avoid that activity for another week until the pain goes  down. d. You may drive when you are no longer taking prescription pain medication, you can comfortably wear a seatbelt, and you can safely maneuver your car and apply brakes. e. Bonita Quin may have sexual intercourse when it is comfortable.  9. FOLLOW UP in our office a. Please call [facility] at [phone] to set up an appointment to see your surgeon in the office for a follow-up appointment approximately 2-3 weeks after your surgery. b. Make sure that you call for this appointment the day you arrive home to insure a convenient appointment time. 10. IF YOU HAVE DISABILITY OR FAMILY LEAVE FORMS, BRING THEM TO THE OFFICE FOR PROCESSING.  DO NOT GIVE THEM TO YOUR DOCTOR.   WHEN TO CALL us [phone]: 1. Poor pain control 2. Reactions / problems with new medications (rash/itching, nausea, etc)  3. Fever over 101.5 F (38.5 C) 4. Inability to urinate 5. Nausea and/or vomiting 6. Worsening swelling or bruising 7. Continued bleeding from incision. 8. Increased pain, redness, or drainage from the incision   The clinic staff is available to answer your questions during regular business hours (8:30am-5pm).  Please dont hesitate to call and ask to speak to one of our nurses for clinical concerns.   If you have a medical emergency, go to the nearest emergency room or call 911.  A surgeon from [facility] is always on call at the [facility], [place] [address], [place], [place]  [address] ? MAIN: [phone] ? TOLL FREE: [phone] ?  FAX [phone] [email or web address]  Managing Pain  Pain after surgery or related to activity is often due to strain/injury to muscle, tendon, nerves and/or incisions.  This pain is usually short-term and will improve in a few months.   Many people find it helpful to do the following things TOGETHER to help speed the process of healing and to get back to regular activity more quickly:  1. Avoid  heavy physical activity at first a. No lifting greater than 20 pounds at first, then increase to lifting as tolerated over the next few weeks b. Do not push through the pain.  Listen to your body and avoid positions and maneuvers than reproduce the pain.  Wait a few days before trying something more intense c. Walking is okay as tolerated, but go slowly and stop when getting sore.  If you can walk 30 minutes without stopping or pain, you can try more intense activity (running, jogging, aerobics, cycling, swimming, treadmill, sex, sports, weightlifting, etc ) d. Remember: If it hurts to do it, then dont do it!  2. Take Acetaminophen Anti-inflammatory medication i. Acetaminophen 500mg  tabs (Tylenol) 1-2 pills with every meal and just before bedtime (avoid if you have liver problems) ii. Take with food/snack around the clock for 1-2 weeks iii. This helps the muscle and nerve tissues become less irritable and calm down faster  3. Use a Heating pad or Ice/Cold Pack a. 4-6 times a day b. May use warm bath/hottub  or showers  4. Try Gentle Massage and/or Stretching  a. at the area of pain many times a day b. stop if you feel  pain - do not overdo it  Try these steps together to help you body heal faster and avoid making things get worse.  Doing just one of these things may not be enough.    If you are not getting better after two weeks or are noticing you are getting worse, contact our office for further advice; we may need to re-evaluate you & see what other things we can do to help.  GETTING TO GOOD BOWEL HEALTH. Irregular bowel habits such as constipation and diarrhea can lead to many problems over time.  Having one soft bowel movement a day is the most important way to prevent further problems.  The anorectal canal is designed to handle stretching and feces to safely manage our ability to get rid of solid waste (feces, poop, stool) out of our body.  BUT, hard constipated stools can act like  ripping concrete bricks and diarrhea can be a burning fire to this very sensitive area of our body, causing inflamed hemorrhoids, anal fissures, increasing risk is perirectal abscesses, abdominal pain/bloating, an making irritable bowel worse.      The goal: ONE SOFT BOWEL MOVEMENT A DAY!  To have soft, regular bowel movements:   Drink plenty of fluids, consider 4-6 tall glasses of water a day.    Take plenty of fiber.  Fiber is the undigested part of plant food that passes into the colon, acting s natures broom to encourage bowel motility and movement.  Fiber can absorb and hold large amounts of water. This results in a larger, bulkier stool, which is soft and easier to pass. Work gradually over several weeks up to 6 servings a day of fiber (25g a day even more if needed) in the form of: o Vegetables -- Root (potatoes, carrots, turnips), leafy green (lettuce, salad greens, celery, spinach), or cooked high residue (cabbage, broccoli, etc) o Fruit -- Fresh (unpeeled skin & pulp), Dried (prunes, apricots, cherries, etc ),  or stewed ( applesauce)  o Whole grain breads, pasta, etc (whole wheat)  o Bran cereals   Bulking Agents -- This type of water-retaining fiber generally is easily obtained each day by one of the following:  o Psyllium bran -- The psyllium plant is remarkable because its ground seeds can retain so much water. This product is available as Metamucil, Konsyl, Effersyllium, Per Diem Fiber, or the less expensive generic preparation in drug and health food stores. Although labeled a laxative, it really is not a laxative.  o Methylcellulose -- This is another fiber derived from wood which also retains water. It is available as Citrucel. o Polyethylene Glycol - and artificial fiber commonly called Miralax or Glycolax.  It is helpful for people with gassy or bloated feelings with regular fiber o Flax Seed - a less gassy fiber than psyllium  No reading or other relaxing activity while on  the toilet. If bowel movements take longer than 5 minutes, you are too constipated  AVOID CONSTIPATION.  High fiber and water intake usually takes care of this.  Sometimes a laxative is needed to stimulate more frequent bowel movements, but   Laxatives are not a good long-term solution as it can wear the colon out.  They can help jump-start bowels if constipated, but should be relied on constantly without discussing with your doctor o Osmotics (Milk of Magnesia, Fleets phosphosoda, Magnesium citrate, MiraLax, GoLytely) are safer than  o Stimulants (Senokot, Castor Oil, Dulcolax, Ex Lax)    o Avoid taking laxatives for more than  7 days in a row.   IF SEVERELY CONSTIPATED, try a Bowel Retraining Program: o Do not use laxatives.  o Eat a diet high in roughage, such as bran cereals and leafy vegetables.  o Drink six (6) ounces of prune or apricot juice each morning.  o Eat two (2) large servings of stewed fruit each day.  o Take one (1) heaping tablespoon of a psyllium-based bulking agent twice a day. Use sugar-free sweetener when possible to avoid excessive calories.  o Eat a normal breakfast.  o Set aside 15 minutes after breakfast to sit on the toilet, but do not strain to have a bowel movement.  o If you do not have a bowel movement by the third day, use an enema and repeat the above steps.   Controlling diarrhea o Switch to liquids and simpler foods for a few days to avoid stressing your intestines further. o Avoid dairy products (especially milk & ice cream) for a short time.  The intestines often can lose the ability to digest lactose when stressed. o Avoid foods that cause gassiness or bloating.  Typical foods include beans and other legumes, cabbage, broccoli, and dairy foods.  Every person has some sensitivity to other foods, so listen to our body and avoid those foods that trigger problems for you. o Adding fiber (Citrucel, Metamucil, psyllium, Miralax) gradually can help thicken stools  by absorbing excess fluid and retrain the intestines to act more normally.  Slowly increase the dose over a few weeks.  Too much fiber too soon can backfire and cause cramping & bloating. o Probiotics (such as active yogurt, Align, etc) may help repopulate the intestines and colon with normal bacteria and calm down a sensitive digestive tract.  Most studies show it to be of mild help, though, and such products can be costly. o Medicines: - Bismuth subsalicylate (ex. Kayopectate, Pepto Bismol) every 30 minutes for up to 6 doses can help control diarrhea.  Avoid if pregnant. - Loperamide (Immodium) can slow down diarrhea.  Start with two tablets (4mg  total) first and then try one tablet every 6 hours.  Avoid if you are having fevers or severe pain.  If you are not better or start feeling worse, stop all medicines and call your doctor for advice o Call your doctor if you are getting worse or not better.  Sometimes further testing (cultures, endoscopy, X-ray studies, bloodwork, etc) may be needed to help diagnose and treat the cause of the diarrhea.  TROUBLESHOOTING IRREGULAR BOWELS 1) Avoid extremes of bowel movements (no bad constipation/diarrhea) 2) Miralax 17gm mixed in 8oz. water or juice-daily. May use BID as needed.  3) Gas-x,Phazyme, etc. as needed for gas & bloating.  4) Soft,bland diet. No spicy,greasy,fried foods.  5) Prilosec over-the-counter as needed  6) May hold gluten/wheat products from diet to see if symptoms improve.  7)  May try probiotics (Align, Activa, etc) to help calm the bowels down 7) If symptoms become worse call back immediately.

## 2015-05-29 NOTE — Interval H&P Note (Signed)
History and Physical Interval Note:  05/29/2015 8:40 AM  Cheryl Fields  has presented today for surgery, with the diagnosis of Incarcarated paraesophageal hiatal hernia with nausea, vomiting, and pain.  The various methods of treatment have been discussed with the patient and family. After consideration of risks, benefits and other options for treatment, the patient has consented to  Procedure(s): XI ROBOTIC ASSISTED LAPAROSCOPIC HIATAL HERNIA  WITH  FUNDOPLICATION (N/A) as a surgical intervention .  The patient's history has been reviewed, patient examined, no change in status, stable for surgery.  I have reviewed the patient's chart and labs.  Questions were answered to the patient's satisfaction.     Aphrodite Harpenau C.

## 2015-05-29 NOTE — Transfer of Care (Signed)
Immediate Anesthesia Transfer of Care Note  Patient: Cheryl Fields  Procedure(s) Performed: Procedure(s): XI ROBOTIC HIATAL HERNIA  WITH  MESH AND NISSEN FUNDOPLICATION (N/A)  Patient Location: PACU  Anesthesia Type:General  Level of Consciousness: awake, alert , oriented and patient cooperative  Airway & Oxygen Therapy: Patient Spontanous Breathing and Patient connected to face mask oxygen  Post-op Assessment: Report given to RN, Post -op Vital signs reviewed and stable and Patient moving all extremities X 4  Post vital signs: stable  Last Vitals:  Filed Vitals:   05/29/15 1241 05/29/15 1245  BP: 146/88 149/84  Pulse: 87 89  Temp: 36.8 C   Resp: 14 15    Last Pain:  Filed Vitals:   05/29/15 1255  PainSc: Asleep      Patients Stated Pain Goal: 4 (05/29/15 [id number])  Complications: No apparent anesthesia complications

## 2015-05-29 NOTE — H&P (Signed)
Cheryl Fields 04/03/2015 11:49 AM Location: [facility] Patient #: [id number] DOB: 11/26/1960 Married / Language: English / Race: White Female  Patient Care Team: Creola Corn, MD as PCP - General (Internal Medicine) Karie Soda, MD as Consulting Physician (General Surgery) Dorena Cookey, MD as Consulting Physician (Gastroenterology)   History of Present Illness  Patient words: reck.  The patient is a 55 year old female who presents with a hiatal hernia. Patient sent by her gastroenterologist, Dr. Dorena Cookey, for concern of a hiatal hernia.  Woman with abdominal complaints in the past. Declined gastroenterology evaluation at that time. Gallstones. Gastric emptying stidy normal. Cardiac workup normal. I performed cholecystectomy on her. She required ERCP for choledocholithiasis. Recovered from that rather well.  Patient always had some heartburn and reflux. Usually mild and controlled with over-the-counter medications such as Alka-Seltzer. Worsened. More LEFT upper quadrant pain. No improvement on proton pump inhibitors including Dexilant. Worsening symptoms. EGD confirmed larger hiatal hernia. Concern for paraesophageal component. Upper GI and chest CT also noted hiatal hernia. Dr Madilyn Fireman discussed with me. Manometry done. Some hypertensive contractions but no major dysmotility. Because of her worsening symptoms and failure on medical therapy, surgical consultation requested. I was concerned about a worsening symptomatic paraesophageal hiatal hernia with worsening dysphagia him a poorly controlled heartburn, postprandial pain. Recommended surgery last year. Because of insurance and other financial issues, she tried to hold off. However she's having more frequent episodes of pain and nausea or vomiting. She is unintentionally lost some weight. Trying to focus on proteins and power shakes.  She can walk 30 minutes without difficulty. She does have some  constipation issues with a bowel movement about every 3 days. Sometimes a week. A little better with a bowel regimen now. Phenergan seems to control her nausea and emesis. No episodes of jaundice. Some depression and anxiety but controlled. She does not smoke   Problem List/Past Medical [name], MD; 04/03/2015 12:19 PM) PARAESOPHAGEAL HIATAL HERNIA (K44.9)  Other Problems [name], MD; 04/03/2015 12:19 PM) Anxiety Disorder Back Pain Depression Gastroesophageal Reflux Disease Hemorrhoids Hypercholesterolemia Migraine Headache  Past Surgical History [name], MD; 04/03/2015 12:19 PM) Gallbladder Surgery - Laparoscopic Hysterectomy (not due to cancer) - Partial Tonsillectomy  Diagnostic Studies History [name], MD; 04/03/2015 12:19 PM) Colonoscopy within last year Mammogram 1-3 years ago  Allergies Gilmer Mor, CMA; 04/03/2015 11:50 AM) Voltaren *DERMATOLOGICALS* ASA Arthritis Strength/Antacid *ANALGESICS - NonNarcotic*  Medication History (Sonya Bynum, CMA; 04/03/2015 11:52 AM) BuPROPion HCl ER (XL) (  Tablet ER 24HR, Oral three times daily) Active. Estradiol (  Tablet, Oral daily) Active. Pravastatin Sodium (  Tablet, Oral as needed) Active. ALPRAZolam (0.5MG  Tablet Disperse, Oral two times daily) Active. Promethazine HCl (  Tablet, Oral daily at bedtime) Active. Hydrocodone-Homatropine (5-1.5MG  Tablet, Oral) Active. Medications Reconciled Estradiol (1.5MG  Tablet, Oral) Active. Soma (  Tablet, Oral) Active. Vitamin D (Cholecalciferol) (400UNIT Tablet, Oral) Active.  Social History [name], MD; 04/03/2015 12:19 PM) Caffeine use Carbonated beverages, Coffee, Tea. No alcohol use No drug use Tobacco use Never smoker.  Family History [name], MD; 04/03/2015 12:19 PM) Breast Cancer Mother. Depression Mother. Heart Disease Mother. Migraine Headache Mother.  Pregnancy / Birth  History [name], MD; 04/03/2015 12:19 PM) Age at menarche 13 years. Age of menopause <43 Gravida 2 Maternal age 63-20 Para 1     Review of Systems [name], MD; 04/03/2015 12:19 PM) General Present- Fatigue. Not Present- Appetite Loss, Chills, Fever, Night Sweats, Weight  Gain and Weight Loss. Skin Present- Dryness. Not Present- Change in Wart/Mole, Hives, Jaundice, New Lesions, Non-Healing Wounds, Rash and Ulcer. HEENT Present- Hearing Loss, Ringing in the Ears, Visual Disturbances and Wears glasses/contact lenses. Not Present- Earache, Hoarseness, Nose Bleed, Oral Ulcers, Seasonal Allergies, Sinus Pain, Sore Throat and Yellow Eyes. Respiratory Not Present- Bloody sputum, Chronic Cough, Difficulty Breathing, Snoring and Wheezing. Breast Present- Breast Mass. Not Present- Breast Pain, Nipple Discharge and Skin Changes. Cardiovascular Not Present- Chest Pain, Difficulty Breathing Lying Down, Leg Cramps, Palpitations, Rapid Heart Rate, Shortness of Breath and Swelling of Extremities. Gastrointestinal Present- Abdominal Pain, Bloating, Excessive gas, Gets full quickly at meals, Hemorrhoids, Indigestion, Nausea and Vomiting. Not Present- Bloody Stool, Change in Bowel Habits, Chronic diarrhea, Constipation, Difficulty Swallowing and Rectal Pain. Female Genitourinary Not Present- Frequency, Nocturia, Painful Urination, Pelvic Pain and Urgency. Musculoskeletal Present- Back Pain, Joint Pain and Joint Stiffness. Not Present- Muscle Pain, Muscle Weakness and Swelling of Extremities. Neurological Present- Headaches. Not Present- Decreased Memory, Fainting, Numbness, Seizures, Tingling, Tremor, Trouble walking and Weakness. Psychiatric Present- Anxiety and Depression. Not Present- Bipolar, Change in Sleep Pattern, Fearful and Frequent crying. Endocrine Not Present- Cold Intolerance, Excessive Hunger, Hair Changes, Heat Intolerance, Hot flashes and New Diabetes. Hematology Present- Easy  Bruising. Not Present- Excessive bleeding, Gland problems, HIV and Persistent Infections.  Vitals There were no vitals taken for this visit.       Physical Exam   General Mental Status-Alert. General Appearance-Not in acute distress, Not Sickly. Orientation-Oriented X3. Hydration-Well hydrated. Voice-Normal. Note: Thin but not cachectic.  Integumentary Global Assessment Upon inspection and palpation of skin surfaces of the - Axillae: non-tender, no inflammation or ulceration, no drainage. and Distribution of scalp and body hair is normal. General Characteristics Temperature - normal warmth is noted.  Head and Neck Head-normocephalic, atraumatic with no lesions or palpable masses. Face Global Assessment - atraumatic, no absence of expression. Neck Global Assessment - no abnormal movements, no bruit auscultated on the right, no bruit auscultated on the left, no decreased range of motion, non-tender. Trachea-midline. Thyroid Gland Characteristics - non-tender.  Eye Eyeball - Left-Extraocular movements intact, No Nystagmus. Eyeball - Right-Extraocular movements intact, No Nystagmus. Cornea - Left-No Hazy. Cornea - Right-No Hazy. Sclera/Conjunctiva - Left-No scleral icterus, No Discharge. Sclera/Conjunctiva - Right-No scleral icterus, No Discharge. Pupil - Left-Direct reaction to light normal. Pupil - Right-Direct reaction to light normal. Note: Continues to wear glasses.  ENMT Ears Pinna - Left - no drainage observed, no generalized tenderness observed. Right - no drainage observed, no generalized tenderness observed. Nose and Sinuses External Inspection of the Nose - no destructive lesion observed. Inspection of the nares - Left - quiet respiration. Right - quiet respiration. Mouth and Throat Lips - Upper Lip - no fissures observed, no pallor noted. Lower Lip - no fissures observed, no pallor noted. Nasopharynx - no discharge present.  Oral Cavity/Oropharynx - Tongue - no dryness observed. Oral Mucosa - no cyanosis observed. Hypopharynx - no evidence of airway distress observed.  Chest and Lung Exam Inspection Movements - Normal and Symmetrical. Accessory muscles - No use of accessory muscles in breathing. Palpation Palpation of the chest reveals - Non-tender. Auscultation Breath sounds - Normal and Clear.  Cardiovascular Auscultation Rhythm - Regular. Murmurs & Other Heart Sounds - Auscultation of the heart reveals - No Murmurs and No Systolic Clicks.  Abdomen Inspection Inspection of the abdomen reveals - No Visible peristalsis and No Abnormal pulsations. Umbilicus - No Bleeding, No Urine drainage. Palpation/Percussion Palpation and  Percussion of the abdomen reveal - Soft, Non Tender, No Rebound tenderness, No Rigidity (guarding) and No Cutaneous hyperesthesia. Note: Abdomen soft and flat. Periumbilical incision. No hernia.  Nondistended  Female Genitourinary Sexual Maturity Tanner 5 - Adult hair pattern. Note: No vaginal bleeding nor discharge. No lymphadenopathy. No inguinal hernias.  Peripheral Vascular Upper Extremity Inspection - Left - No Cyanotic nailbeds, Not Ischemic. Right - No Cyanotic nailbeds, Not Ischemic.  Neurologic Neurologic evaluation reveals -normal attention span and ability to concentrate, able to name objects and repeat phrases. Appropriate fund of knowledge , normal sensation and normal coordination. Mental Status Affect - not angry, not paranoid. Cranial Nerves-Normal Bilaterally. Gait-Normal.  Neuropsychiatric Mental status exam performed with findings of-able to articulate well with normal speech/language, rate, volume and coherence, thought content normal with ability to perform basic computations and apply abstract reasoning and no evidence of hallucinations, delusions, obsessions or homicidal/suicidal ideation.  Less anxious today  Musculoskeletal Global  Assessment Spine, Ribs and Pelvis - no instability, subluxation or laxity. Right Upper Extremity - no instability, subluxation or laxity.  Lymphatic Head & Neck  General Head & Neck Lymphatics: Bilateral - Description - No Localized lymphadenopathy. Axillary  General Axillary Region: Bilateral - Description - No Localized lymphadenopathy. Femoral & Inguinal  Generalized Femoral & Inguinal Lymphatics: Left - Description - No Localized lymphadenopathy. Right - Description - No Localized lymphadenopathy.    Assessment & Plan   PARAESOPHAGEAL HIATAL HERNIA (K44.9) Impression: Moderate paraesophageal hiatal hernia. Poorly controlled reflux. Worsening symptoms of pain and nausea/vomiting. Concerning for partial obstruction.  I still recommended surgical repair. Laparoscopic versus robotic fundoplication repair. Manometry notes her motility is strong enough to tolerate a fundoplication. Mesh reinforcement more likely: biologic versus small sheet of ultrapro mesh reinforcement to avoid recurrence given younger age.  Things have gradually worsened over the past year. She is in a better financial situation. The patient & her husband wish to be aggressive and proceed with surgery now.  I have re-reviewed the the patient's records, history, medications, and allergies.  I have re-examined the patient.  I again discussed intraoperative plans and goals of post-operative recovery.  The patient agrees to proceed.   Current Plans You are being scheduled for surgery - Our schedulers will call you.  You should hear from our office's scheduling department within 5 working days about the location, date, and time of surgery. We try to make accommodations for patient's preferences in scheduling surgery, but sometimes the OR schedule or the surgeon's schedule prevents [place] from making those accommodations.  If you have not heard from our office [phone]) in 5 working days, call the office and ask for your  surgeon's nurse.  If you have other questions about your diagnosis, plan, or surgery, call the office and ask for your surgeon's nurse.  Pt Education - [facility] Esophageal Surgery Diet HCI (Parveen Freehling): discussed with patient and provided information. Pt Education - [facility] Laparoscopic Surgery HCI The anatomy & physiology of the foregut and anti-reflux mechanism was discussed. The pathophysiology of hiatal herniation and GERD was discussed. Natural history risks without surgery was discussed. The patient's symptoms are not adequately controlled by medicines and other non-operative treatments. I feel the risks of no intervention will lead to serious problems that outweigh the operative risks; therefore, I recommended surgery to reduce the hiatal hernia out of the chest and fundoplication to rebuild the anti-reflux valve and control reflux better. Need for a thorough workup to rule out the differential diagnosis and plan treatment was  explained. I explained laparoscopic techniques with possible need for an open approach.  Risks such as bleeding, infection, abscess, leak, need for further treatment, heart attack, death, and other risks were discussed. I noted a good likelihood this will help address the problem. Goals of post-operative recovery were discussed as well. Possibility that this will not correct all symptoms was explained. Post-operative dysphagia, need for short-term liquid & pureed diet, inability to vomit, possibility of reherniation, possible need for medicines to help control symptoms in addition to surgery were discussed. We will work to minimize complications. Educational handouts further explaining the pathology, treatment options, and dysphagia diet was given as well. Questions were answered. The patient expresses understanding & wishes to proceed with surgery.  [name], M.D., F.A.C.S. Gastrointestinal and Minimally Invasive Surgery [facility] [address] [place], [place] [address] [phone] Main / Paging

## 2015-05-29 NOTE — Op Note (Signed)
05/29/2015  12:31 PM  PATIENT:  Cheryl Fields  55 y.o. female  Patient Care Team: Creola CornJohn Russo, MD as PCP - General (Internal Medicine) Cheryl SodaSteven Rozlynn Lippold, MD as Consulting Physician (General Surgery) Cheryl CookeyJohn Hayes, MD as Consulting Physician (Gastroenterology)  PRE-OPERATIVE DIAGNOSIS:  incarcarated paraesophageal hiatal hernia with nausea  POST-OPERATIVE DIAGNOSIS:  incarcarated paraesophageal hiatal hernia with nausea  PROCEDURE:  Procedure(s): XI ROBOTIC reduction of paraesophageal hiatal hernia 2. Type II mediastinal dissection. 3. Primary repair of hiatal hernia over pledgets. 4.  Mesh reinforcement with biologic BioA Gore mesh 4. Anterior & posterior gastropexy. 5. Nissen fundoplication 2 cm over a 56-French bougie  SURGEON:  Cheryl SodaSteven Lania Zawistowski, MD  ASSIST:  Cheryl LeveeAlicia Thomas, MD  ANESTHESIA:   local and regional  EBL:  Total I/O In: 2000 [I.V.:2000] Out: 150 [Urine:100; Blood:50]  Delay start of Pharmacological VTE agent (>24hrs) due to surgical blood loss or risk of bleeding:  no  ANESTHESIA: 1. General anesthesia. 2. Local anesthetic in a field block around all port sites.  SPECIMEN:  Mediastinal hernia sac (not sent).  DRAINS:  A 19-French Blake drain goes from the right upper quadrant along the lesser curvature of the stomach into the mediastinum.  COUNTS:  YES  PLAN OF CARE: Admit for overnight observation  PATIENT DISPOSITION:  PACU - hemodynamically stable.  INDICATION:   Patient with symptomatic paraesophageal hiatal hernia.  The patient has had extensive work-up & we feel the patient will benefit from repair:  The anatomy & physiology of the foregut and anti-reflux mechanism was discussed.  The pathophysiology of hiatal herniation and GERD was discussed.  Natural history risks without surgery was discussed.   The patient's symptoms are not adequately controlled by medicines and other non-operative treatments.  I feel the risks of no intervention will lead to  serious problems that outweigh the operative risks; therefore, I recommended surgery to reduce the hiatal hernia out of the chest and fundoplication to rebuild the anti-reflux valve and control reflux better.  Need for a thorough workup to rule out the differential diagnosis and plan treatment was explained.  I explained laparoscopic techniques with possible need for an open approach.  Risks such as bleeding, infection, abscess, leak, need for further treatment, heart attack, death, and other risks were discussed.   I noted a good likelihood this will help address the problem.  Goals of post-operative recovery were discussed as well.  Possibility that this will not correct all symptoms was explained.  Post-operative dysphagia, need for short-term liquid & pureed diet, inability to vomit, possibility of reherniation, possible need for medicines to help control symptoms in addition to surgery were discussed.  We will work to minimize complications.   Educational handouts further explaining the pathology, treatment options, and dysphagia diet was given as well.  Questions were answered.  The patient expresses understanding & wishes to proceed with surgery.  OR FINDINGS:   Moderate-sized paraesophageal hiatal hernia with 40% of the stomach in the mediastinum, mostly left sided.  There was a 6x8cm hiatal defect.  It is a primary repair over pledgets.  Onlay mesh reinforcement with biologic mesh Adriana Simas(Cook Biodesign 7x10cm mesh cut to a "U" shape)  The patient has a 2 cm Nissen fundoplication that was done over 56-French bougie.  The patient has had anterior and posterior gastropexy.  DESCRIPTION:   Informed consent was confirmed.  The patient received IV antibiotics prior to incision.  The underwent general anesthesia without difficulty.  A Foley catheter sterilely placed.  The patient  was positioned in split leg with arms tucked. The abdomen was prepped and draped in the sterile fashion.  Surgical time-out  confirmed our plan.  I placed a 8 mm robotic port in the left upper quadrant paramedian region using Varess technique with the patient in steep reverse Trendelenburg and left side up.  Entry was clean.  We induced carbon dioxide insufflation.  Camera inspection revealed no injury.  Under direct visualization, I placed additional ports.  I also placed a 5 mm port in the left subxiphoid region under direct visualization.  I removed that and placed an Omega-shaped rigid Nathanson liver retractor to lift the left lateral sector of the liver anteriorly to expose the esophageal hiatus.  This was secured to the bed using the iron man system.   I could see an obvious hiatal hernia with almost half the stomach up in the mediastinum, especially the left side.  We grasped the anterior mediastinal sac at the apex of the crus.  I scored through that and got into the anterior mediastinum.  I was able to free the mediastinal sac from its attachments to the pericardium and bilateral pleura using primarily focused gentle blunt dissection as well as focused vessel sealer dissection.  I transected phrenoesophageal attachments to the inner right crus, preserving a two centimeter cuff of mediastinal sac until I found the base of the crura.  I then came around anteriorly on the left side and freed up the phrenoesophageal attachments of the mediastinal sac on the medial part of the left crus on the superior half.  I did careful mediastinal dissection to free the mediastinal sac.  With that, we could relieve the suction cup affect of the hernia sac and help reduce the stomach back down into the abdomen, flipped back approriately.    We ligated the short gastrics along the lesser curvature of the stomach about a third way down and then came up proximally over the fundus.  We released the attachments of the stomach to the retroperitoneum until we were able to connect with the prior dissection on the left crus.  Left the left gastric  artery at its takeoff intact.   We completed the release of phrenoesophageal attachments to the medial part of the left crus down to its base.  With this, we had circumferential mobilization.    We placed the stomach and esophagus on axial tension.  I then did a Type II mediastinal dissection where I freed the esophagus from its attachments to the aorta, spine, pleura, and pericardium using primarily gentle blunt as well as focused ultrasonic dissection.  We saw the anterior & posterior vagus nerves intact.  We preserved it at all times.  I procedded to dissect about 20 cm proximally into the mediastinum.  With that I could straighten out the esophagus and get 4 cm of intra-abdominal length of the esophagus at a best estimation.  I freed the anterior mediastinal sac off the esophagus & stomach.  We saw the anterior vagus nerve and freed the sac off of the vagus.  I dissected out & removed the fatty  epiphrenic pads at the esophagogastric junction. With that, I could better define the esophagogastric junction.  I confirmed the the patient had 5 cm of intra-abdominal esophageal length off tension.  I brought the fundus of the stomach posterior to the esophagus over to the right side.  The wrap was mobile with the classic shoe shine maneuver.  Wrap became together gently.  We reflected the stomach  left laterally and closed the esophageal hiatus using 0 Ethibond stitch using horizontal mattress stitches with pledgets on both sides.  I did that x3 stitches.  The crura had good substance and they came together well without any tension.  However given the larger defect and relatively young age, I did not reinforce it with biologic mesh.  7 x 10 cm bio design absorbable mesh made by Adriana Simas.  I cut it into a U shape somewhat eccentric.  Laid it on the closure.  The more narrow side of the UA laid more medially along the right crus between the right crus and the IVC.  With the brought her part of the U lay over the  diaphragm more towards the splenic side.  A curative that on the medial corners of the U tails in the lateral corner sales using interrupted Ethibond suture.  A left lower corner part of the diaphragm just cephalad the spleen as secured with interrupted sutures well.  That on the right medial corner just distal to the base of the right crus.  I then did a posterior gastropexy by taking of #1 Ethibond stitches to the posterior part of the right side of the wrap and thru the crural closure including the mesh and tied that down for good posterior gastropexy up against the hiatal closure.  I then did a left anterior gastropexy taking the apex of the left side of the wrap and tacked it to the left anterior crura just posterior to the phrenic vein. I tied that down.  This helped the wrap for completion.    Next, Anesthesia passed a 56-French bougie transorally.  It was a lighted bougie and we did do this under axial tension.  It passed down easily without resistance.    I then did a classic 2 cm Nissen fundoplication on the true esophagus above the cardia using Ethibond stitch in the left side of the wrap, then anterior esophagus, and then right side of the wrap and tied that down. Did 3 stitches.  I measured it and it was 2 cm in length.  We removed the bougie.  It was intact.  I did a right anterior gastropexy through the right posterior side the wrap to the right crural closure and mesh to help tack that down as well.  The wrap was soft and floppy.  I placed a drain as noted above.  I did irrigation and ensured hemostasis.  I saw no evidence of any leak or perforation or other abnormality.  I removed the [facility] liver retractor under direct visualization.  I evacuated carbon dioxide and removed the ports.  The skin was closed with Monocryl and sterile dressings applied.  The patient is being extubated and brought back to the recovery room.  I discussed postop care in detail with the patient and family in in the  office.  Discussed again with the patient in the holding area.  I am about to discuss it with the family as well.  [name], M.D., F.A.C.S. Gastrointestinal and Minimally Invasive Surgery [facility] [address], [address] [place], [place] [address] [phone] Main / Paging

## 2015-05-29 NOTE — Anesthesia Procedure Notes (Signed)
Procedure Name: Intubation Date/Time: 05/29/2015 9:05 AM Performed by: Illene SilverEVANS, Daltin Crist E Pre-anesthesia Checklist: Patient identified, Emergency Drugs available, Suction available and Patient being monitored Patient Re-evaluated:Patient Re-evaluated prior to inductionOxygen Delivery Method: Circle System Utilized Preoxygenation: Pre-oxygenation with 100% oxygen Intubation Type: IV induction Ventilation: Mask ventilation without difficulty Laryngoscope Size: Mac and 3 Grade View: Grade II Tube type: Oral Tube size: 7.0 mm Number of attempts: 1 Airway Equipment and Method: Stylet and Oral airway Placement Confirmation: ETT inserted through vocal cords under direct vision,  positive ETCO2 and breath sounds checked- equal and bilateral Secured at: 19 cm Tube secured with: Tape Dental Injury: Teeth and Oropharynx as per pre-operative assessment

## 2015-05-29 NOTE — Anesthesia Preprocedure Evaluation (Signed)
Anesthesia Evaluation  Patient identified by MRN, date of birth, ID band Patient awake    Reviewed: Allergy & Precautions, H&P , NPO status , Patient's Chart, lab work & pertinent test results  Airway Mallampati: II  TM Distance: >3 FB Neck ROM: Full    Dental  (+) Teeth Intact, Dental Advisory Given   Pulmonary neg pulmonary ROS,    Pulmonary exam normal breath sounds clear to auscultation       Cardiovascular Exercise Tolerance: Good Normal cardiovascular exam Rhythm:Regular Rate:Normal     Neuro/Psych  Headaches, PSYCHIATRIC DISORDERS Anxiety Depression    GI/Hepatic Neg liver ROS, hiatal hernia, GERD  Controlled,  Endo/Other  negative endocrine ROS  Renal/GU negative Renal ROS     Musculoskeletal negative musculoskeletal ROS (+)   Abdominal (+) - obese,   Peds  Hematology negative hematology ROS (+)   Anesthesia Other Findings   Reproductive/Obstetrics                             Anesthesia Physical  Anesthesia Plan  ASA: II  Anesthesia Plan: General   Post-op Pain Management:    Induction: Intravenous, Rapid sequence and Cricoid pressure planned  Airway Management Planned: Oral ETT  Additional Equipment:   Intra-op Plan:   Post-operative Plan: Extubation in OR  Informed Consent: I have reviewed the patients History and Physical, chart, labs and discussed the procedure including the risks, benefits and alternatives for the proposed anesthesia with the patient or authorized representative who has indicated his/her understanding and acceptance.   Dental advisory given  Plan Discussed with: CRNA and Surgeon  Anesthesia Plan Comments:         Anesthesia Quick Evaluation

## 2015-05-30 ENCOUNTER — Observation Stay (HOSPITAL_COMMUNITY): Payer: BLUE CROSS/BLUE SHIELD

## 2015-05-30 ENCOUNTER — Encounter (HOSPITAL_COMMUNITY): Payer: Self-pay | Admitting: Surgery

## 2015-05-30 DIAGNOSIS — K440 Diaphragmatic hernia with obstruction, without gangrene: Secondary | ICD-10-CM | POA: Diagnosis not present

## 2015-05-30 MED ORDER — ALUM & MAG HYDROXIDE-SIMETH 200-200-20 MG/5ML PO SUSP: 30.0000 mL | Freq: Four times a day (QID) | ORAL | Status: DC | PRN

## 2015-05-30 MED ORDER — VITAMIN C 500 MG/5ML PO SYRP
500.0000 mg | ORAL_SOLUTION | Freq: Two times a day (BID) | ORAL | Status: DC
  Administered 2015-05-30: 500 mg via ORAL
  Filled 2015-05-30 (×4): qty 5

## 2015-05-30 MED ORDER — PANTOPRAZOLE SODIUM 40 MG PO TBEC: 40.0000 mg | DELAYED_RELEASE_TABLET | Freq: Every day | ORAL | Status: DC

## 2015-05-30 MED ORDER — SODIUM CHLORIDE 0.9 % IV SOLN: 250.0000 mL | INTRAVENOUS | Status: DC | PRN

## 2015-05-30 MED ORDER — BISACODYL 10 MG RE SUPP: 10.0000 mg | Freq: Two times a day (BID) | RECTAL | Status: DC | PRN

## 2015-05-30 MED ORDER — LACTATED RINGERS IV BOLUS (SEPSIS)
1000.0000 mL | Freq: Three times a day (TID) | INTRAVENOUS | Status: DC | PRN
  Administered 2015-05-30: 1000 mL via INTRAVENOUS
  Filled 2015-05-30 (×2): qty 1000

## 2015-05-30 MED ORDER — HYDROCODONE-ACETAMINOPHEN 7.5-325 MG/15ML PO SOLN
15.0000 mL | ORAL | Status: DC | PRN
  Administered 2015-05-30: 15 mL via ORAL
  Administered 2015-05-30 – 2015-05-31 (×4): 30 mL via ORAL
  Filled 2015-05-30: qty 30
  Filled 2015-05-30: qty 15
  Filled 2015-05-30 (×3): qty 30

## 2015-05-30 MED ORDER — ACETAMINOPHEN 160 MG/5ML PO SOLN: 650.0000 mg | Freq: Four times a day (QID) | ORAL | Status: DC | PRN

## 2015-05-30 MED ORDER — SIMETHICONE 80 MG PO CHEW
80.0000 mg | CHEWABLE_TABLET | Freq: Four times a day (QID) | ORAL | Status: DC
  Administered 2015-05-30 (×2): 80 mg via ORAL
  Filled 2015-05-30 (×6): qty 1

## 2015-05-30 MED ORDER — PANTOPRAZOLE SODIUM 40 MG PO PACK
40.0000 mg | PACK | Freq: Every day | ORAL | Status: DC
  Administered 2015-05-30: 40 mg via ORAL
  Filled 2015-05-30 (×3): qty 20

## 2015-05-30 MED ORDER — SODIUM CHLORIDE 0.9% FLUSH: 3.0000 mL | INTRAVENOUS | Status: DC | PRN

## 2015-05-30 MED ORDER — ASPIRIN 81 MG PO CHEW
324.0000 mg | CHEWABLE_TABLET | Freq: Four times a day (QID) | ORAL | Status: DC | PRN
  Filled 2015-05-30: qty 4

## 2015-05-30 MED ORDER — SODIUM CHLORIDE 0.9% FLUSH: 3.0000 mL | Freq: Two times a day (BID) | INTRAVENOUS | Status: DC

## 2015-05-30 MED ORDER — BUPROPION HCL 75 MG PO TABS
150.0000 mg | ORAL_TABLET | Freq: Three times a day (TID) | ORAL | Status: DC
  Administered 2015-05-30 (×2): 150 mg via ORAL
  Filled 2015-05-30 (×6): qty 2

## 2015-05-30 NOTE — Progress Notes (Signed)
PHARMACY NOTE  MD orders noted for all oral meds to be in liquid or crushed form.  Have therefore changed bupropion from 450 mg XL daily to 150 mg IR TID to allow crushing of medication.  Elie Goodyandy Shakeera Rightmyer, PharmD, BCPS Pager: [phone] 05/30/2015  7:14 AM

## 2015-05-30 NOTE — Progress Notes (Signed)
[facility]  [address]  [place], [place] [address] Phone: [phone] FAX: [phone]   Cheryl PlanFrances D Fields [id number] 08/18/1960  Problem List:   Principal Problem:   Paraesophageal hiatal hernia s/p robotic repair/mesh/Nissen 05/29/2015 Active Problems:   Anxiety   Dyspnea and respiratory abnormality   1 Day Post-Op  05/29/2015  POST-OPERATIVE DIAGNOSIS: incarcarated paraesophageal hiatal hernia with nausea  PROCEDURE: Procedure(s): XI ROBOTIC reduction of paraesophageal hiatal hernia 2. Type II mediastinal dissection. 3. Primary repair of hiatal hernia over pledgets. 4. Mesh reinforcement with biologic BioA Gore mesh 4. Anterior & posterior gastropexy. 5. Nissen fundoplication 2 cm over a 56-French bougie  SURGEON: Karie SodaSteven Leonilda Cozby, MD  OR FINDINGS:   Moderate-sized paraesophageal hiatal hernia with 40% of the stomach in the mediastinum, mostly left sided. There was a 6x8cm hiatal defect.  It is a primary repair over pledgets. Onlay mesh reinforcement with biologic mesh Adriana Simas(Cook Biodesign 7x10cm mesh cut to a "U" shape)  The patient has a 2 cm Nissen fundoplication that was done over 56-French bougie. The patient has had anterior and posterior gastropexy.  Assessment  Recovering okay.  Challenge in patient with chronic pain issues.  Fields:  -adv pureed diet -try to improve pain control -prob d/c drain upon d/c home -VTE prophylaxis- SCDs, etc -mobilize as tolerated to help recovery - GET HER UP!!!  D/C patient from hospital when patient meets criteria (anticipate in 1-2 day(s)):  Tolerating oral intake well Ambulating well Adequate pain control without IV medications Urinating  Having flatus Disposition planning in place  I updated the patient's status to the patient and spouse.  Recommendations were made.  Questions were answered.  They expressed understanding & appreciation.  Ardeth SportsmanSteven C. [name], M.D.,  F.A.C.S. Gastrointestinal and Minimally Invasive Surgery [facility] [address] [place], [place] [address] [phone] Main / Paging   05/30/2015  Subjective:  Sore but better.  Walked a little.  Husband at bedside.  Objective:  Vital signs:  Filed Vitals:   05/29/15 2125 05/30/15 0203 05/30/15 0603 05/30/15 1000  BP: 148/78 153/85 162/76 124/57  Pulse: 89 78 63 70  Temp: 97.6 F (36.4 C) 97.8 F (36.6 C) 98.3 F (36.8 C) 98 F (36.7 C)  TempSrc: Axillary Oral Oral Oral  Resp: 16 16 16 18   Height:      Weight:      SpO2: 96% 97% 97% 98%    Last BM Date: 05/29/15  Intake/Output   Yesterday:  05/10 0701 - 05/11 0700 In: 4223.3 [I.V.:4223.3] Out: 3895 [Urine:3805; Drains:40; Blood:50] This shift:     Bowel function:  Flatus: y  BM: n  Drain: serosanguinous  Physical Exam:  General: Pt awake/alert/oriented x4 in no acute distress.  Tired not toxic Eyes: PERRL, normal EOM.  Sclera clear.  No icterus Neuro: CN II-XII intact w/o focal sensory/motor deficits. Lymph: No head/neck/groin lymphadenopathy Psych:  No delerium/psychosis/paranoia HENT: Normocephalic, Mucus membranes moist.  No thrush Neck: Supple, No tracheal deviation Chest: No chest wall pain w good excursion CV:  Pulses intact.  Regular rhythm MS: Normal AROM mjr joints.  No obvious deformity Abdomen: Soft.  Nondistended.  Mildly tender at incisions only.  No evidence of peritonitis.  No incarcerated hernias. Ext:  SCDs BLE.  No mjr edema.  No cyanosis Skin: No petechiae / purpura  Results:   Labs: No results found for this or any previous visit (from the past 48 hour(s)).  Imaging / Studies: Dg  Esophagus W/water Sol Cm  05/30/2015  CLINICAL DATA:  Postop day 1 status post robotic reduction of large paraesophageal hiatal hernia with primary repair, mesh reinforcement, gastropexy, and Nissen fundoplication. EXAM: ESOPHOGRAM/BARIUM SWALLOW TECHNIQUE:  Single contrast examination was performed using water-soluble contrast. FLUOROSCOPY TIME:  Radiation Exposure Index (as provided by the fluoroscopic device): 195 microGy*m^2 COMPARISON:  03/07/2012 and 01/22/2013 FINDINGS: The patient is postoperative and has limited ability to turn. The exam was performed with patient in a semi upright position. The pharyngeal phase of contrast was not assessed. The patient was allowed to drink water-soluble contrast from a straw. Primary peristaltic waves in the esophagus terminated in the mid esophagus on all swallows. The contrast column collected in the distal esophagus and contrast with slowly percolated to the stomach. No hiatal hernia is observed. Soft tissue density along the gastric level through which the contrast would extent is compatible with the fundoplication. In the vicinity of the a fundoplication, maximal luminal diameter was about 3 mm, for example on image 7/6. Contrast was observed to fill the stomach. IMPRESSION: 1. No leak in the vicinity of the surgery. Fundoplication noted with maximum diameter of the lumen in the vicinity of the fundoplication at 3 mm on today's exam. Some of this narrowing may be due to postoperative swelling. No recurrent hernia. 2. Disruption of primary peristaltic waves in the mid esophagus on all swallows, favoring dysmotility but this dysmotility may be due to recovery from recent surgery. Electronically Signed   By: Gaylyn Rong M.D.   On: 05/30/2015 09:49    Medications / Allergies: per chart  Antibiotics: Anti-infectives    Start     Dose/Rate Route Frequency Ordered Stop   05/29/15 0806  ceFAZolin (ANCEF) 2 g in dextrose 5 % 50 mL IVPB     2 g 140 mL/hr over 30 Minutes Intravenous On call to O.R. 05/29/15 4098 05/29/15 0903   05/29/15 0806  metroNIDAZOLE (FLAGYL) IVPB 500 mg     500 mg 100 mL/hr over 60 Minutes Intravenous On call to O.R. 05/29/15 0806 05/29/15 0919        Note: Portions of this report  may have been transcribed using voice recognition software. Every effort was made to ensure accuracy; however, inadvertent computerized transcription errors may be present.   Any transcriptional errors that result from this process are unintentional.     [name], M.D., F.A.C.S. Gastrointestinal and Minimally Invasive Surgery [facility] [address] [place], [place] [address] [phone] Main / Paging   05/30/2015  CARE TEAM:  PCP: Gwen Pounds, MD  Outpatient Care Team: Patient Care Team: Creola Corn, MD as PCP - General (Internal Medicine) Karie Soda, MD as Consulting Physician (General Surgery) Dorena Cookey, MD as Consulting Physician (Gastroenterology)  Inpatient Treatment Team: Treatment Team: Attending Provider: Karie Soda, MD; Technician: Vella Raring, NT; Technician: Almyra Brace, NT

## 2015-05-30 NOTE — Progress Notes (Signed)
Attempted to give LR Bolus due to UOP<18620ml, lost IV access, patient did not want another IV placed, PO fluids encouraged patient dirinking tea and H2O.  Will continue to monitor if patient unable to void, will encourage placement of IV to receive bolus

## 2015-05-31 DIAGNOSIS — K440 Diaphragmatic hernia with obstruction, without gangrene: Secondary | ICD-10-CM | POA: Diagnosis not present

## 2015-05-31 MED ORDER — HYDROCODONE-ACETAMINOPHEN 7.5-325 MG/15ML PO SOLN: 15.0000 mL | ORAL | Status: DC | PRN

## 2015-05-31 NOTE — Progress Notes (Signed)
Discharge instructions discussed with patient and family, verbalized understanding and agreement, prescriptions given to patient

## 2015-05-31 NOTE — Discharge Summary (Signed)
Physician Discharge Summary  Patient ID: Cheryl Fields MRN: [id number] DOB/AGE: February 17, 1960 55 y.o.  Admit date: 05/29/2015 Discharge date: 05/31/2015  Patient Care Team: Creola Corn, MD as PCP - General (Internal Medicine) Karie Soda, MD as Consulting Physician (General Surgery) Dorena Cookey, MD as Consulting Physician (Gastroenterology)  Admission Diagnoses: Principal Problem:   Paraesophageal hiatal hernia s/p robotic repair/mesh/Nissen 05/29/2015 Active Problems:   Anxiety   Dyspnea and respiratory abnormality   Discharge Diagnoses:  Principal Problem:   Paraesophageal hiatal hernia s/p robotic repair/mesh/Nissen 05/29/2015 Active Problems:   Anxiety   Dyspnea and respiratory abnormality   POST-OPERATIVE DIAGNOSIS:  incarcarated paraesophageal hiatal hernia with nausea  PROCEDURE:  XI ROBOTIC reduction of paraesophageal hiatal hernia 2. Type II mediastinal dissection. 3. Primary repair of hiatal hernia over pledgets. 4. Mesh reinforcement with biologic BioA Gore mesh 4. Anterior & posterior gastropexy. 5. Nissen fundoplication 2 cm over a 56-French bougie  SURGEON: Karie Soda, MD  OR FINDINGS:   Moderate-sized paraesophageal hiatal hernia with 40% of the stomach in the mediastinum, mostly left sided. There was a 6x8cm hiatal defect.  It is a primary repair over pledgets. [place] mesh reinforcement with biologic mesh Adriana Simas Biodesign 7x10cm mesh cut to a "U" shape)  The patient has a 2 cm Nissen fundoplication that was done over 56-French bougie. The patient has had anterior and posterior gastropexy.  Consults: None  Hospital Course:   The patient underwent the surgery above.  UGI swallow OK (see below).  Postoperatively, the patient gradually mobilized and advanced to a pureed diet.  Mild dysphagia at most.  Pain and other symptoms were treated aggressively.    By the time of discharge, the patient was walking well the hallways, eating food, having  flatus.  Anxiety controlled.  Mediastinal drain removed.  Pain was well-controlled on an oral medications.  Based on meeting discharge criteria and continuing to recover, I felt it was safe for the patient to be discharged from the hospital to further recover with close followup. Postoperative recommendations were discussed in detail.  They are written as well.   Significant Diagnostic Studies:  No results found for this or any previous visit (from the past 72 hour(s)).  Dg Esophagus W/water Sol Cm  05/30/2015  CLINICAL DATA:  Postop day 1 status post robotic reduction of large paraesophageal hiatal hernia with primary repair, mesh reinforcement, gastropexy, and Nissen fundoplication. EXAM: ESOPHOGRAM/BARIUM SWALLOW TECHNIQUE: Single contrast examination was performed using water-soluble contrast. FLUOROSCOPY TIME:  Radiation Exposure Index (as provided by the fluoroscopic device): 195 microGy*m^2 COMPARISON:  03/07/2012 and 01/22/2013 FINDINGS: The patient is postoperative and has limited ability to turn. The exam was performed with patient in a semi upright position. The pharyngeal phase of contrast was not assessed. The patient was allowed to drink water-soluble contrast from a straw. Primary peristaltic waves in the esophagus terminated in the mid esophagus on all swallows. The contrast column collected in the distal esophagus and contrast with slowly percolated to the stomach. No hiatal hernia is observed. Soft tissue density along the gastric level through which the contrast would extent is compatible with the fundoplication. In the vicinity of the a fundoplication, maximal luminal diameter was about 3 mm, for example on image 7/6. Contrast was observed to fill the stomach. IMPRESSION: 1. No leak in the vicinity of the surgery. Fundoplication noted with maximum diameter of the lumen in the vicinity of the fundoplication at 3 mm on today's exam. Some of this narrowing may be due to postoperative  swelling. No recurrent hernia. 2. Disruption of primary peristaltic waves in the mid esophagus on all swallows, favoring dysmotility but this dysmotility may be due to recovery from recent surgery. Electronically Signed   By: Gaylyn Rong M.D.   On: 05/30/2015 09:49    Discharge Exam: Blood pressure 124/55, pulse 68, temperature 98.8 F (37.1 C), temperature source Oral, resp. rate 18, height 4\' 11"  (1.499 m), weight 43.999 kg (97 lb), SpO2 96 %.  General: Pt awake/alert/oriented x4 in no major acute distress Eyes: PERRL, normal EOM. Sclera nonicteric Neuro: CN II-XII intact w/o focal sensory/motor deficits. Lymph: No head/neck/groin lymphadenopathy Psych:  No delerium/psychosis/paranoia HENT: Normocephalic, Mucus membranes moist.  No thrush Neck: Supple, No tracheal deviation Chest: No pain.  Good respiratory excursion. CV:  Pulses intact.  Regular rhythm MS: Normal AROM mjr joints.  No obvious deformity.  Mod back soreness low thoracic Abdomen: Soft, Nondistended.  Min tender.  No incarcerated hernias. Ext:  SCDs BLE.  No significant edema.  No cyanosis Skin: No petechiae / purpura  Discharged Condition: good   Past Medical History  Diagnosis Date  . Hyperlipidemia   . Migraines   . Depression   . Anxiety   . GERD (gastroesophageal reflux disease)   . History of hiatal hernia     PARAESOPHAGEAL  . HOH (hard of hearing) BOTH EARS,HAS HEARING AIDES  . Choledocholithiasis with chronic cholecystitis 08/13/2011    Past Surgical History  Procedure Laterality Date  . Nasal septum surgery    . Cystectomy    . Tubal ligation    . Tonsillectomy    . Intraoperative cholangiogram  09/03/2011    Procedure: INTRAOPERATIVE CHOLANGIOGRAM;  Surgeon: [name], MD;  Location: [facility];  Service: General;;  . Ercp  09/04/2011    Procedure: ENDOSCOPIC RETROGRADE CHOLANGIOPANCREATOGRAPHY (ERCP);  Surgeon: Graylin Shiver, MD;  Location: [facility];  Service: Endoscopy;  Laterality:  N/A;  . Cholecystectomy  09/03/11  . Esophageal manometry N/A 05/07/2014    Procedure: ESOPHAGEAL MANOMETRY (EM);  Surgeon: Dorena Cookey, MD;  Location: [facility];  Service: Endoscopy;  Laterality: N/A;  . Abdominal hysterectomy      partial, RIGHT OVARY REMOVED    Social History   Social History  . Marital Status: Married    Spouse Name: N/A  . Number of Children: N/A  . Years of Education: N/A   Occupational History  . Not on file.   Social History Main Topics  . Smoking status: Never Smoker   . Smokeless tobacco: Never Used  . Alcohol Use: No  . Drug Use: No  . Sexual Activity: Not on file   Other Topics Concern  . Not on file   Social History Narrative    Family History  Problem Relation Age of Onset  . Cancer Mother     breast  . Cancer Paternal Grandmother     colon  . Cancer Paternal Grandfather     olon    Current Facility-Administered Medications  Medication Dose Route Frequency Provider Last Rate Last Dose  . acetaminophen (TYLENOL) solution 650 mg  650 mg Oral Q6H PRN Karie Soda, MD      . acetaminophen (TYLENOL) suppository 650 mg  650 mg Rectal Q6H PRN Karie Soda, MD      . albuterol (PROVENTIL) (2.5 MG/3ML) 0.083% nebulizer solution 2.5 mg  2.5 mg Nebulization Q6H PRN Karie Soda, MD      . ALPRAZolam Prudy Feeler) tablet 0.25-0.5 mg  0.25-0.5 mg Oral TID PRN  Karie Soda, MD      . alum & mag hydroxide-simeth (MAALOX/MYLANTA) 200-200-20 MG/5ML suspension 30 mL  30 mL Oral Q6H PRN Karie Soda, MD      . ascorbic acid (VITAMIN C) 500 MG/5ML syrup 500 mg  500 mg Oral BID Karie Soda, MD   500 mg at 05/30/15 1132  . aspirin chewable tablet 324 mg  324 mg Oral Q6H PRN Karie Soda, MD      . bisacodyl (DULCOLAX) suppository 10 mg  10 mg Rectal Q12H PRN Karie Soda, MD      . buPROPion [facility]) tablet 150 mg  150 mg Oral TID Karie Soda, MD   150 mg at 05/30/15 1611  . chlorproMAZINE (THORAZINE) 25 mg in sodium chloride 0.9 % 25 mL IVPB  25 mg  Intravenous Q6H PRN Karie Soda, MD      . diphenhydrAMINE (BENADRYL) 12.5 MG/5ML elixir 12.5 mg  12.5 mg Oral Q6H PRN Karie Soda, MD       Or  . diphenhydrAMINE (BENADRYL) injection 12.5 mg  12.5 mg Intravenous Q6H PRN Karie Soda, MD      . enoxaparin (LOVENOX) injection 30 mg  30 mg Subcutaneous Q24H Karie Soda, MD   30 mg at 05/31/15 0746  . estradiol (ESTRACE) tablet 0.5 mg  0.5 mg Oral Daily Karie Soda, MD   0.5 mg at 05/30/15 1131  . hydrALAZINE (APRESOLINE) injection 10 mg  10 mg Intravenous Q2H PRN Karie Soda, MD      . HYDROcodone-acetaminophen (HYCET) 7.5-325 mg/15 ml solution 15-30 mL  15-30 mL Oral Q4H PRN Karie Soda, MD   30 mL at 05/31/15 0413  . HYDROmorphone (DILAUDID) injection 0.5-2 mg  0.5-2 mg Intravenous Q2H PRN Karie Soda, MD   2 mg at 05/30/15 0931  . lactated ringers bolus 1,000 mL  1,000 mL Intravenous Q8H PRN Karie Soda, MD      . lactated ringers bolus 1,000 mL  1,000 mL Intravenous Q8H PRN Karie Soda, MD   1,000 mL at 05/30/15 2222  . lip balm (CARMEX) ointment 1 application  1 application Topical BID Karie Soda, MD   1 application at 05/30/15 2157  . magic mouthwash  15 mL Oral QID PRN Karie Soda, MD      . menthol-cetylpyridinium (CEPACOL) lozenge 3 mg  1 lozenge Oral PRN Karie Soda, MD      . methocarbamol (ROBAXIN) 1,000 mg in dextrose 5 % 50 mL IVPB  1,000 mg Intravenous Q6H PRN Karie Soda, MD       Or  . methocarbamol (ROBAXIN) tablet 500 mg  500 mg Oral Q6H PRN Karie Soda, MD      . metoCLOPramide (REGLAN) injection 5-10 mg  5-10 mg Intravenous Q6H PRN Karie Soda, MD      . naphazoline-glycerin (CLEAR EYES) ophth solution 1-2 drop  1-2 drop Both Eyes TID PRN Karie Soda, MD      . ondansetron (ZOFRAN-ODT) disintegrating tablet 4 mg  4 mg Oral Q6H PRN Karie Soda, MD       Or  . ondansetron [facility]) injection 4 mg  4 mg Intravenous Q6H PRN Karie Soda, MD   4 mg at 05/29/15 1616  . pantoprazole sodium (PROTONIX) 40 mg/20 mL  oral suspension 40 mg  40 mg Oral Q1200 Karie Soda, MD   40 mg at 05/30/15 1700  . phenol (CHLORASEPTIC) mouth spray 2 spray  2 spray Mouth/Throat PRN Karie Soda, MD      . polyethylene glycol [facility] /  GLYCOLAX) packet 17 g  17 g Oral Daily Karie Soda, MD   17 g at 05/30/15 1130  . polyethylene glycol (MIRALAX / GLYCOLAX) packet 17 g  17 g Oral Q12H PRN Karie Soda, MD      . prochlorperazine (COMPAZINE) injection 5-10 mg  5-10 mg Intravenous Q4H PRN Karie Soda, MD   10 mg at 05/29/15 2032  . simethicone (MYLICON) chewable tablet 80 mg  80 mg Oral QID Karie Soda, MD   80 mg at 05/30/15 1844     Allergies  Allergen Reactions  . Voltaren [Diclofenac Sodium] Nausea Only    Disposition: 01-Home or Self Care  Discharge Instructions    Call MD for:  extreme fatigue    Complete by:  As directed      Call MD for:  extreme fatigue    Complete by:  As directed      Call MD for:  hives    Complete by:  As directed      Call MD for:  hives    Complete by:  As directed      Call MD for:  persistant nausea and vomiting    Complete by:  As directed      Call MD for:  persistant nausea and vomiting    Complete by:  As directed      Call MD for:  redness, tenderness, or signs of infection (pain, swelling, redness, odor or green/yellow discharge around incision site)    Complete by:  As directed      Call MD for:  redness, tenderness, or signs of infection (pain, swelling, redness, odor or green/yellow discharge around incision site)    Complete by:  As directed      Call MD for:  severe uncontrolled pain    Complete by:  As directed      Call MD for:  severe uncontrolled pain    Complete by:  As directed      Call MD for:    Complete by:  As directed   Temperature > 101.64F     Call MD for:    Complete by:  As directed   Temperature > 101.64F     Diet - low sodium heart healthy    Complete by:  As directed   Start with bland, low residue diet for a few days, then advance to a  heart healthy (low fat, high fiber) diet.  If you feel nauseated or constipated, simplify to a liquid only diet for 48 hours until you are feeling better (no more nausea, farting/passing gas, having a bowel movement, etc...).  If you cannot tolerate even drinking liquids, or feeling worse, let your surgeon know or go to the Emergency Department for help.     Diet general    Complete by:  As directed   Pureed diet x 2-3 weeks - SEE DISCHARGE INTRUSTIONS (Eating after esophageal surgery)  CRUSH MEDICATIONS for 1-2 weeks until dysphagia (sticking with swallowing) goes down     Discharge instructions    Complete by:  As directed   Please see discharge instruction sheets.   Also refer to any handouts/printouts that may have been given from the [facility] surgery office (if you visited [place] there before surgery) Please call our office if you have any questions or concerns [phone]     Discharge instructions    Complete by:  As directed   Please see discharge instruction sheets.   Also refer to any handouts/printouts that  may have been given from the [facility] surgery office (if you visited [place] there before surgery) Please call our office if you have any questions or concerns [phone]     Discharge wound care:    Complete by:  As directed   If you have closed incisions: Shower and bathe over these incisions with soap and water every day.  It is OK to wash over the dressings: they are waterproof. Remove all surgical dressings on postoperative day #3.  You do not need to replace dressings over the closed incisions unless you feel more comfortable with a Band-Aid covering it.   Please call our office [phone] if you have further questions.     Discharge wound care:    Complete by:  As directed   If you have closed incisions: Shower and bathe over these incisions with soap and water every day.  It is OK to wash over the dressings: they are waterproof. Remove all surgical dressings on postoperative  day #3.  You do not need to replace dressings over the closed incisions unless you feel more comfortable with a Band-Aid covering it.   If you have an open wound: That requires packing, so please see wound care instructions.   In general, remove all dressings, wash wound with soap and water and then replace with saline moistened gauze.  Do the dressing change at least every day.    Please call our office [phone] if you have further questions.     Driving Restrictions    Complete by:  As directed   No driving until off narcotics and can safely swerve away without pain during an emergency     Driving Restrictions    Complete by:  As directed   No driving until off narcotics and can safely swerve away without pain during an emergency     Increase activity slowly    Complete by:  As directed   Walk an hour a day.  Use 20-30 minute walks.  When you can walk 30 minutes without difficulty, it is fine to restart low impact/moderate activities such as biking, jogging, swimming, sexual activity, etc.  Eventually you can increase to unrestricted activity when not feeling pain.  If you feel pain: STOP!Marland Kitchen   Let pain protect you from overdoing it.  Use ice/heat & over-the-counter pain medications to help minimize soreness.  If that is not enough, then use your narcotic pain prescription as needed to remain active.  It is better to take extra pain medications and be more active than to stay bedridden to avoid all pain medications.     Increase activity slowly    Complete by:  As directed   Walk an hour a day.  Use 20-30 minute walks.  When you can walk 30 minutes without difficulty, it is fine to restart low impact/moderate activities such as biking, jogging, swimming, sexual activity, etc.  Eventually you can increase to unrestricted activity when not feeling pain.  If you feel pain: STOP!Marland Kitchen   Let pain protect you from overdoing it.  Use ice/heat & over-the-counter pain medications to help minimize soreness.   If that is not enough, then use your narcotic pain prescription as needed to remain active.  It is better to take extra pain medications and be more active than to stay bedridden to avoid all pain medications.     Lifting restrictions    Complete by:  As directed   Avoid heavy lifting initially, <20 pounds at first.  Do not push through pain.   You have no specific weight limit: If it hurts to do, DON'T DO IT.    If you feel no pain, you are not injuring anything.  Pain will protect you from injury.   Coughing and sneezing are far more stressful to your incision than any lifting.   Avoid resuming heavy lifting (>50 pounds) or other intense activity until off all narcotic pain medications.   When want to exercise more, give yourself 2 weeks to gradually get back to full intense exercise/activity.     Lifting restrictions    Complete by:  As directed   Avoid heavy lifting initially, <20 pounds at first.   Do not push through pain.   You have no specific weight limit: If it hurts to do, DON'T DO IT.    If you feel no pain, you are not injuring anything.  Pain will protect you from injury.   Coughing and sneezing are far more stressful to your incision than any lifting.   Avoid resuming heavy lifting (>50 pounds) or other intense activity until off all narcotic pain medications.   When want to exercise more, give yourself 2 weeks to gradually get back to full intense exercise/activity.     May shower / Bathe    Complete by:  As directed   SHOWER EVERY DAY.  It is fine for dressings or wounds to be washed/rinsed.  Use gentle soap & water.  This will help the incisions and/or wounds get clean & minimize infection.     May shower / Bathe    Complete by:  As directed   SHOWER EVERY DAY.  It is fine for dressings or wounds to be washed/rinsed.  Use gentle soap & water.  This will help the incisions and/or wounds get clean & minimize infection.     May walk up steps    Complete by:  As directed       May walk up steps    Complete by:  As directed      Sexual Activity Restrictions    Complete by:  As directed   Sexual activity as tolerated.  Do not push through pain.  Pain will protect you from injury.     Sexual Activity Restrictions    Complete by:  As directed   Sexual activity as tolerated.  Do not push through pain.  Pain will protect you from injury.     Walk with assistance    Complete by:  As directed   Walk over an hour a day.  May use a walker/cane/companion to help with balance and stamina.     Walk with assistance    Complete by:  As directed   Walk over an hour a day.  May use a walker/cane/companion to help with balance and stamina.            Medication List    STOP taking these medications        HYDROcodone-acetaminophen 7.5-325 MG tablet  Commonly known as:  NORCO  Replaced by:  HYDROcodone-acetaminophen 7.5-325 mg/15 ml solution      TAKE these medications        ALPRAZolam 0.5 MG tablet  Commonly known as:  XANAX  Take 0.25-0.5 mg by mouth 3 (three) times daily as needed for anxiety. For anxiety     BAYER BACK & BODY 500-32.5 MG Tabs  Generic drug:  Aspirin-Caffeine  Take 1 tablet by mouth every 6 (six) hours as needed (  Pain).     buPROPion 150 MG 24 hr tablet  Commonly known as:  WELLBUTRIN XL  Take 450 mg by mouth every morning.     CLEAR EYES COMPLETE OP  Apply 1 drop to eye 3 (three) times daily as needed (Dry, irritated eyes).     estradiol 0.5 MG tablet  Commonly known as:  ESTRACE  Take 0.5 mg by mouth daily. On for 21 days then of for 7.     HYDROcodone-acetaminophen 7.5-325 mg/15 ml solution  Commonly known as:  HYCET  Take 15-30 mLs by mouth every 4 (four) hours as needed for moderate pain or severe pain.     promethazine 12.5 MG tablet  Commonly known as:  PHENERGAN  Take 0.5-1 tablets (6.25-12.5 mg total) by mouth every 6 (six) hours as needed for nausea. For nausea           Follow-up Information    Follow up with  [name]., MD.   Specialty:  General Surgery   Contact information:   [address] [place] [address] [phone]       Schedule an appointment as soon as possible for a visit in 3 weeks to follow up.   Why:  To follow up after your operation, To follow up after your hospital stay       Signed: Lorenso Courier, M.D., F.A.C.S. Gastrointestinal and Minimally Invasive Surgery [facility] [address], [address] [place], [place] [address] [phone] Main / Paging   05/31/2015, 7:55 AM

## 2015-06-06 ENCOUNTER — Emergency Department (HOSPITAL_COMMUNITY)
Admission: EM | Admit: 2015-06-06 | Discharge: 2015-06-06 | Discharge status: Absent without leave | Payer: BLUE CROSS/BLUE SHIELD

## 2015-07-11 ENCOUNTER — Other Ambulatory Visit: Payer: Self-pay | Admitting: Surgery

## 2015-07-11 ENCOUNTER — Ambulatory Visit (HOSPITAL_COMMUNITY)
Admission: RE | Admit: 2015-07-11 | Discharge: 2015-07-11 | Disposition: A | Discharge status: Home / Self Care | Payer: BLUE CROSS/BLUE SHIELD | Source: Ambulatory Visit | Attending: Surgery | Admitting: Surgery

## 2015-07-11 ENCOUNTER — Ambulatory Visit
Admission: RE | Admit: 2015-07-11 | Discharge: 2015-07-11 | Disposition: A | Discharge status: Home / Self Care | Payer: BLUE CROSS/BLUE SHIELD | Source: Ambulatory Visit | Attending: Surgery | Admitting: Surgery

## 2015-07-11 ENCOUNTER — Other Ambulatory Visit: Payer: Self-pay

## 2015-07-11 ENCOUNTER — Other Ambulatory Visit (HOSPITAL_COMMUNITY): Payer: Self-pay | Admitting: Surgery

## 2015-07-11 DIAGNOSIS — R9431 Abnormal electrocardiogram [ECG] [EKG]: Secondary | ICD-10-CM | POA: Diagnosis not present

## 2015-07-11 DIAGNOSIS — R52 Pain, unspecified: Secondary | ICD-10-CM

## 2015-07-11 DIAGNOSIS — R079 Chest pain, unspecified: Secondary | ICD-10-CM

## 2015-07-11 DIAGNOSIS — R12 Heartburn: Secondary | ICD-10-CM

## 2015-07-11 DIAGNOSIS — K449 Diaphragmatic hernia without obstruction or gangrene: Secondary | ICD-10-CM

## 2015-07-12 ENCOUNTER — Ambulatory Visit
Admission: RE | Admit: 2015-07-12 | Discharge: 2015-07-12 | Disposition: A | Discharge status: Home / Self Care | Payer: BLUE CROSS/BLUE SHIELD | Source: Ambulatory Visit | Attending: Surgery | Admitting: Surgery

## 2015-07-12 DIAGNOSIS — R12 Heartburn: Secondary | ICD-10-CM

## 2015-07-12 DIAGNOSIS — K449 Diaphragmatic hernia without obstruction or gangrene: Secondary | ICD-10-CM

## 2015-12-25 ENCOUNTER — Other Ambulatory Visit: Payer: Self-pay | Admitting: Internal Medicine

## 2015-12-25 DIAGNOSIS — G43109 Migraine with aura, not intractable, without status migrainosus: Secondary | ICD-10-CM

## 2015-12-25 DIAGNOSIS — R42 Dizziness and giddiness: Secondary | ICD-10-CM

## 2015-12-25 DIAGNOSIS — R4781 Slurred speech: Secondary | ICD-10-CM

## 2015-12-27 ENCOUNTER — Other Ambulatory Visit: Payer: Self-pay | Admitting: Internal Medicine

## 2015-12-27 DIAGNOSIS — R1312 Dysphagia, oropharyngeal phase: Secondary | ICD-10-CM

## 2015-12-30 ENCOUNTER — Other Ambulatory Visit (HOSPITAL_COMMUNITY): Payer: Self-pay | Admitting: Internal Medicine

## 2015-12-30 DIAGNOSIS — R1319 Other dysphagia: Secondary | ICD-10-CM

## 2016-01-02 ENCOUNTER — Ambulatory Visit (HOSPITAL_COMMUNITY)
Admission: RE | Admit: 2016-01-02 | Discharge: 2016-01-02 | Disposition: A | Discharge status: Home / Self Care | Payer: BLUE CROSS/BLUE SHIELD | Source: Ambulatory Visit | Attending: Internal Medicine | Admitting: Internal Medicine

## 2016-01-02 DIAGNOSIS — R1310 Dysphagia, unspecified: Secondary | ICD-10-CM | POA: Diagnosis present

## 2016-01-02 DIAGNOSIS — R1319 Other dysphagia: Secondary | ICD-10-CM | POA: Diagnosis not present

## 2016-01-10 ENCOUNTER — Ambulatory Visit: Payer: BLUE CROSS/BLUE SHIELD

## 2016-03-17 ENCOUNTER — Encounter: Payer: Self-pay | Admitting: Internal Medicine

## 2016-04-02 ENCOUNTER — Encounter: Payer: Self-pay | Admitting: Gastroenterology

## 2016-04-15 ENCOUNTER — Other Ambulatory Visit (HOSPITAL_COMMUNITY): Payer: Self-pay | Admitting: Surgery

## 2016-04-15 DIAGNOSIS — R4702 Dysphasia: Secondary | ICD-10-CM

## 2016-04-22 ENCOUNTER — Ambulatory Visit (HOSPITAL_COMMUNITY)
Admission: RE | Admit: 2016-04-22 | Discharge: 2016-04-22 | Disposition: A | Discharge status: Home / Self Care | Payer: Managed Care, Other (non HMO) | Source: Ambulatory Visit | Attending: Surgery | Admitting: Surgery

## 2016-04-22 DIAGNOSIS — Z9889 Other specified postprocedural states: Secondary | ICD-10-CM | POA: Diagnosis not present

## 2016-04-22 DIAGNOSIS — R4702 Dysphasia: Secondary | ICD-10-CM | POA: Insufficient documentation

## 2016-04-23 ENCOUNTER — Telehealth: Payer: Self-pay | Admitting: Gastroenterology

## 2016-04-23 NOTE — Telephone Encounter (Signed)
Rec'd from [facility] forward [phone]  Pages to Dr. Russella Dar

## 2016-04-27 ENCOUNTER — Telehealth: Payer: Self-pay | Admitting: Gastroenterology

## 2016-04-27 ENCOUNTER — Encounter (HOSPITAL_COMMUNITY): Payer: Managed Care, Other (non HMO)

## 2016-04-27 NOTE — Telephone Encounter (Signed)
Rec'd from [facility] forward 32 pages to Dr. Russella Dar

## 2016-04-30 ENCOUNTER — Ambulatory Visit: Payer: Self-pay | Admitting: Surgery

## 2016-04-30 NOTE — H&P (Signed)
Cheryl Fields 04/13/2016 3:00 PM Location: [facility] Patient #: [id number] DOB: [birth year] Married / Language: English / Race: White Female   History of Present Illness Cheryl Sportsman MD; 04/14/2016 10:38 AM) The patient is a 56 year old female who presents with dysphagia. Note for "Dysphagia": The patient returns status post robotically-assisted reduction repair/closure of paraesophageal hiatal hernia with Nissen fundoplication 05/29/2015.   She is 10 months status post surgery. I'm not heard for her in 9 months. The patient notes she's had intermittent episodes of swallowing and food sticking. Occasionally spitting up. No major retching though. It seems to be sort of a background issue. Many days she can eat anything without difficulty. Occasionally spitting up once or twice a month. Liquids seem to bother her more than solids. Has never really faded away. It concerned her. She lost insurance for quite a while so was trying to deal with it without any medical help. Eventually got insurance again. Discussed with primary care physician. Had modified barium swallow done by speech therapy where they wondered if she had some slowing of her esophagus. Preoperative manometry showed good esophageal function with anything some hyperactivity not hypo-. Patient can usually finish her meals but sometimes feels full. She lost weight but has gained back to 108 pounds which is the best time seen her. Patient also feels a small bump in her right side of her abdomen. No particular pain with that. More of a curiosity. She still struggles with her chronic background back pain and chest soreness. That has not markedly changed. Still struggles with some hyperflatulence. She says she lost her job because she was passing gas so often that there were complaints and she had to leave. This happened last summer. She's been trying to use Gas-X and Beano occasionally with some help. Ran  out of insurance. Mildly getting reestablished. Because she was struggling with some intermittent dysphagia, she wished to be evaluated. She is due to switch to a different gastroenterology group.    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 Type II mediastinal dissection. 3. Primary repair of hiatal hernia over pledgets. 4. Mesh reinforcement with biologic BioA Gore mesh 4. Anterior & posterior gastropexy. 5. Nissen fundoplication 2 cm over a 56-French bougie  SURGEON: Karie Soda, MD  OR FINDINGS:  Moderate-sized paraesophageal hiatal hernia with 40% of the stomach in the mediastinum, mostly left sided. There was a 6x8cm hiatal defect.  It is a primary repair over pledgets. [place] mesh reinforcement with biologic mesh Adriana Simas Biodesign 7x10cm mesh cut to a "U" shape)  The patient has a 2 cm Nissen fundoplication that was done over 56-French bougie. The patient has had anterior and posterior gastropexy.   Problem List/Past Medical Cheryl Sportsman, MD; 04/13/2016 3:16 PM) PARAESOPHAGEAL HIATAL HERNIA (K44.9)  CHEST PAIN (R07.9)  HEARTBURN (R12)   Past Surgical History Cheryl Sportsman, MD; 04/13/2016 3:16 PM) Gallbladder Surgery - Laparoscopic  Hysterectomy (not due to cancer) - Partial  Tonsillectomy   Diagnostic Studies History Cheryl Sportsman, MD; 04/13/2016 3:16 PM) Colonoscopy  within last year Mammogram  1-3 years ago  Allergies Timmothy Euler, CMA; 04/13/2016 3:01 PM) Voltaren *DERMATOLOGICALS*  ASA Arthritis Strength/Antacid *ANALGESICS - NonNarcotic*  Allergies Reconciled   Medication History Timmothy Euler, CMA; 04/13/2016 3:01 PM) ALPRAZolam (0.5MG   Tablet, Oral daily) Active. Promethazine HCl (12.5MG  Tablet, Oral daily) Active. Estradiol (0.5MG  Tablet, Oral daily) Active. Hycet (7.5-325MG /15ML Solution, 15-30 Milliliter Oral every four hours, as needed for pain, Taken starting 06/06/2015) Active. Estradiol (1.5MG  Tablet, Oral) Active. Soma (  Tablet, Oral) Active. Vitamin D (Cholecalciferol) (400UNIT Tablet, Oral) Active. BuPROPion HCl ER (XL) (  Tablet ER 24HR, Oral three times daily) Active. Pravastatin Sodium (  Tablet, Oral as needed) Active. ALPRAZolam (0.5MG  Tablet Disint, Oral two times daily) Active. Hydrocodone-Homatropine (5-1.5MG  Tablet, Oral) Active. Medications Reconciled  Social History Cheryl Sportsman, MD; 04/13/2016 3:16 PM) Caffeine use  Carbonated beverages, Coffee, Tea. No alcohol use  No drug use  Tobacco use  Never smoker.  Family History Cheryl Sportsman, MD; 04/13/2016 3:16 PM) Breast Cancer  Mother. Depression  Mother. Heart Disease  Mother. Migraine Headache  Mother.  Pregnancy / Birth History Cheryl Sportsman, MD; 04/13/2016 3:16 PM) Age at menarche  13 years. Age of menopause  <45 Gravida  2 Maternal age  12-20 Para  1  Other Problems Cheryl Sportsman, MD; 04/13/2016 3:16 PM) Anxiety Disorder  Back Pain  Depression  Gastroesophageal Reflux Disease  Hemorrhoids  Hypercholesterolemia  Migraine Headache     Review of Systems Cheryl Sportsman, MD; 04/13/2016 3:26 PM) General Present- Fatigue. Not Present- Appetite Loss, Chills, Fever, Night Sweats, Weight Gain and Weight Loss. Skin Present- Dryness. Not Present- Change in Wart/Mole, Hives, Jaundice, New Lesions, Non-Healing Wounds, Rash and Ulcer. HEENT Present- Hearing Loss, Ringing in the Ears, Visual Disturbances and Wears glasses/contact lenses. Not Present- Earache, Hoarseness, Nose Bleed, Oral Ulcers, Seasonal Allergies, Sinus Pain, Sore Throat and Yellow Eyes. Respiratory Not Present- Bloody  sputum, Chronic Cough, Difficulty Breathing, Snoring and Wheezing. Breast Present- Breast Mass. Not Present- Breast Pain, Nipple Discharge and Skin Changes. Cardiovascular Not Present- Chest Pain, Difficulty Breathing Lying Down, Leg Cramps, Palpitations, Rapid Heart Rate, Shortness of Breath and Swelling of Extremities. Gastrointestinal Present- Abdominal Pain, Bloating, Excessive gas, Gets full quickly at meals, Hemorrhoids, Indigestion, Nausea and Vomiting. Not Present- Bloody Stool, Change in Bowel Habits, Chronic diarrhea, Constipation, Difficulty Swallowing and Rectal Pain. Female Genitourinary Not Present- Frequency, Nocturia, Painful Urination, Pelvic Pain and Urgency. Musculoskeletal Present- Back Pain, Joint Pain and Joint Stiffness. Not Present- Muscle Pain, Muscle Weakness and Swelling of Extremities. Neurological Present- Headaches. Not Present- Decreased Memory, Fainting, Numbness, Seizures, Tingling, Tremor, Trouble walking and Weakness. Psychiatric Present- Anxiety and Depression. Not Present- Bipolar, Change in Sleep Pattern, Fearful and Frequent crying. Endocrine Not Present- Cold Intolerance, Excessive Hunger, Hair Changes, Heat Intolerance, Hot flashes and New Diabetes. Hematology Present- Easy Bruising. Not Present- Excessive bleeding, Gland problems, HIV and Persistent Infections.  Vitals Barron Alvine Bradford CMA; 04/13/2016 3:01 PM) 04/13/2016 3:01 PM Weight: 108.4 lb Height: 58in Body Surface Area: 1.4 m Body Mass Index: 22.66 kg/m  Temp.: 98.37F  Pulse: 75 (Regular)  BP: 118/78 (Sitting, Left Arm, Standard)       Physical Exam Cheryl Sportsman MD; 04/13/2016 3:41 PM) General Mental Status-Alert. General Appearance-Not in acute distress, Not Sickly. Orientation-Oriented X3. Hydration-Well hydrated. Voice-Normal. Note: Thin but not cachectic.   Integumentary Global Assessment Normal Exam - Axillae: non-tender, no inflammation or ulceration, no  drainage. and Distribution of scalp and body hair is normal. General Characteristics Temperature - normal warmth is noted.  Head and Neck Head-normocephalic, atraumatic with no lesions or palpable masses. Face Global Assessment - atraumatic, no absence of expression. Neck Global Assessment - no abnormal movements, no bruit auscultated on the right, no  bruit auscultated on the left, no decreased range of motion, non-tender. Trachea-midline. Thyroid Gland Characteristics - non-tender.  Eye Eyeball - Left-Extraocular movements intact, No Nystagmus. Eyeball - Right-Extraocular movements intact, No Nystagmus. Cornea - Left-No Hazy. Cornea - Right-No Hazy. Sclera/Conjunctiva - Left-No scleral icterus, No Discharge. Sclera/Conjunctiva - Right-No scleral icterus, No Discharge. Pupil - Left-Direct reaction to light normal. Pupil - Right-Direct reaction to light normal. Note: Continues to wear glasses.   ENMT Ears Pinna - Left - no drainage observed, no generalized tenderness observed. Right - no drainage observed, no generalized tenderness observed. Nose and Sinuses Nose - no destructive lesion observed. Nares - Left - quiet respiration. Right - quiet respiration. Mouth and Throat Lips - Upper Lip - no fissures observed, no pallor noted. Lower Lip - no fissures observed, no pallor noted. Nasopharynx - no discharge present. Oral Cavity/Oropharynx - Tongue - no dryness observed. Oral Mucosa - no cyanosis observed. Hypopharynx - no evidence of airway distress observed.  Chest and Lung Exam Inspection Movements - Normal and Symmetrical. Accessory muscles - No use of accessory muscles in breathing. Palpation Normal exam - Non-tender. Auscultation Breath sounds - Normal and Clear. Note: Clear. Mild soreness on lateral rib cages.   Cardiovascular Auscultation Rhythm - Regular. Murmurs & Other Heart Sounds - Normal exam - No Murmurs and No Systolic  Clicks.  Abdomen Inspection Normal Exam - No Visible peristalsis and No Abnormal pulsations. Umbilicus - No Bleeding, No Urine drainage. Palpation/Percussion Normal exam - Soft, Non Tender, No Rebound tenderness, No Rigidity (guarding) and No Cutaneous hyperesthesia. Note: 2 cm right lateral subcutaneous mass reducible consistent with small incisional hernia at the right lateral buttock port site.   Abdomen soft and flat. Periumbilical incision. No hernia there. Nontender. Nondistended. No guarding. No peritonitis.   Female Genitourinary Sexual Maturity Tanner 5 - Adult hair pattern. Note: No vaginal bleeding nor discharge. No lymphadenopathy. No inguinal hernias.   Peripheral Vascular Upper Extremity Inspection - Left - No Cyanotic nailbeds, Not Ischemic. Right - No Cyanotic nailbeds, Not Ischemic.  Neurologic Neurologic evaluation reveals -normal attention span and ability to concentrate, able to name objects and repeat phrases. Appropriate fund of knowledge , normal sensation and normal coordination. Mental Status Affect - not angry, not paranoid. Cranial Nerves-Normal Bilaterally. Gait-Normal.  Neuropsychiatric Mental status exam performed with findings of-able to articulate well with normal speech/language, rate, volume and coherence, thought content normal with ability to perform basic computations and apply abstract reasoning and no evidence of hallucinations, delusions, obsessions or homicidal/suicidal ideation.  Musculoskeletal Global Assessment Spine, Ribs and Pelvis - no instability, subluxation or laxity. Right Upper Extremity - no instability, subluxation or laxity.  Lymphatic Head & Neck General Head & Neck Lymphatics: Bilateral - Description - No Localized lymphadenopathy. Axillary General Axillary Region: Bilateral - Description - No Localized lymphadenopathy. Femoral & Inguinal Generalized Femoral & Inguinal Lymphatics: Left: Right -  Description - No Localized lymphadenopathy. Description - No Localized lymphadenopathy.    Assessment & Plan Cheryl Sportsman MD; 04/14/2016 10:39 AM) DYSPHASIA (R47.02) Impression: Uncertain etiology. Could be chronic mild dysphagia from the fundoplication and mesh reinforcement. She's not seen to be malnourished. She is not having worsening debilitating dysphagia. Sounds like sort of a chronic background. Is very intermittent with some occasional spitting up and chest pain.  Gas-X/Mylanta/Beano as needed for gassiness. Avoid carbonation. Again focus on smaller more frequent meals.  Get a upper GI esophagram barium swallow. She had sort of a modified one that was more laryngeal  then upper GI & foregut evaluation. Make sure she does not have a slipped wrap nor recurrent hiatal hernia.  Get a gastric emptying study to make sure that is not a factor. It was normal 5 years ago.  She may benefit from repeat manometry to rule out dysmotility. Not have weak peristalsis on preoperative evaluation. There was concerned that it was may be somewhat hyperactive. Did not seem classic for nutcracker or diffuse esophageal spasm, but it may be helpful to figure out if she has reproduction of pain with dysmotility.  I recommend she keep her appointment with gastroenterology. See if she would benefit from endoscopy and possible dilation. She is due to see Dr. Russella Dar with [place] [facility] soon. Current Plans Follow Up - Call [facility] office after tests / studies doneto discuss further plans Pt Education - [facility] - General recommendations Pt Education - [facility] Esophageal Surgery Diet HCI (Dio Giller): discussed with patient and provided information. INCISIONAL HERNIA, WITHOUT OBSTRUCTION OR GANGRENE (K43.2) Impression: Probable small incisional hernia at one of the right lateral robotic 8mm port site. 2 cm mass easily reducible. Does not seem to be likely a source of major pain or discomfort.  Reasonable to have it repaired  laparoscopically with underlay mesh. Likely to hold off on any surgery intervention until rest of the workup is complete. She's more curious then really asymptomatic from it.  Does not do well from a pain and recovery standpoint with any operation, especially with a hernia operation. She did not seem severely symptomatic for her. Just is more curious. She wants to hold off on any intervention at this time Current Plans Follow up if no improvement or if symptoms worsen Schedule for Surgery to repair a small incisional hernia in the right side of your abdomen if it worsens or you were not feeling better. CHRONIC CONSTIPATION (K59.09) Current Plans Pt Education - [facility] Constipation (AT) Pt Education - [facility] Good Bowel Health (Hardeep Reetz)   Addendum: Upper GI shows pills staying.  Concern for possible distal esophageal stricture.  Could be natural narrowing.  Severe dysmotility.  Offered upper endoscopy with possible balloon dilation.  Further evaluation.  She is interested in proceeding.  Recommended gastric emptying study for concerns of possible delayed gast  She declines at this time, that she may revisit this

## 2016-05-04 ENCOUNTER — Encounter: Payer: Self-pay | Admitting: Gastroenterology

## 2016-05-04 ENCOUNTER — Ambulatory Visit (INDEPENDENT_AMBULATORY_CARE_PROVIDER_SITE_OTHER): Payer: Managed Care, Other (non HMO) | Admitting: Gastroenterology

## 2016-05-04 VITALS — BP 130/70 | HR 72 | Ht 59.0 in | Wt 108.0 lb

## 2016-05-04 DIAGNOSIS — R1310 Dysphagia, unspecified: Secondary | ICD-10-CM

## 2016-05-04 DIAGNOSIS — R933 Abnormal findings on diagnostic imaging of other parts of digestive tract: Secondary | ICD-10-CM

## 2016-05-04 DIAGNOSIS — R1319 Other dysphagia: Secondary | ICD-10-CM

## 2016-05-04 NOTE — Patient Instructions (Signed)
Please call and reschedule your Gastric Emptying scan.   You have been scheduled for an endoscopy. Please follow written instructions given to you at your visit today. If you use inhalers (even only as needed), please bring them with you on the day of your procedure. Your physician has requested that you go to www.[email or web address].[email or web address] and enter the access code given to you at your visit today. This web site gives a general overview about your procedure. However, you should still follow specific instructions given to you by our office regarding your preparation for the procedure.  Thank you for choosing me and  Gastroenterology.  Venita Lick. Pleas Koch., MD., Clementeen Graham

## 2016-05-04 NOTE — Progress Notes (Signed)
History of Present Illness: This is a 56 year old female self referred for the evaluation of dysphagia. She underwent repair of an incarcerated paraesophageal hernia and Nissen fundoplication in May 2017 by Dr. Michaell Cowing. She relates problems with dysphagia since that time. She has dysphagia with liquids and solids with about 95% of her swallows by her estimate. She states it takes her almost an hour to finish a meal. She has maintained her weight. She was previously followed by Dr. Dorena Cookey. Last EGD was performed in February 2016 by Dr. Madilyn Fireman showed a large hiatal hernia, possible esophageal component, and moderate antral gastritis. Screening colonoscopy performed in February 2016 by Dr. Madilyn Fireman showed internal hemorrhoids, otherwise normal. She also relates a small buldge her right upper quadrant at the site of a prior port incision.   UGI series 4/4/ 2018: 1. Nissen fundoplication. Defect at the GE junction consistent with history of Nissen fundoplication. Maximal channel opening was 6 mm. A 13 mm barium tablet would not progress through the GE junction due to channel diameter. No stasis with liquids. 2. Prominent volume of partially digested food in the stomach, despite report of NPO after midnight, limiting gastric study. No causative gastric outlet obstruction or delayed gastric emptying. The material is not tightly cohesive to suggest bezoar. 3. Normal esophagus, including motility.   Allergies  Allergen Reactions  . Voltaren [Diclofenac Sodium] Nausea Only   Outpatient Medications Prior to Visit  Medication Sig Dispense Refill  . ALPRAZolam (XANAX) 0.5 MG tablet Take 0.25-0.5 mg by mouth 3 (three) times daily as needed for anxiety. For anxiety    . Aspirin-Caffeine (BAYER BACK & BODY) 500-32.5 MG TABS Take 1 tablet by mouth every 6 (six) hours as needed (Pain).    Marland Kitchen buPROPion (WELLBUTRIN XL) 150 MG 24 hr tablet Take 450 mg by mouth every morning.     Marland Kitchen estradiol (ESTRACE) 0.5 MG tablet  Take 0.5 mg by mouth daily. On for 21 days then of for 7.  2  . HYDROcodone-acetaminophen (HYCET) 7.5-325 mg/15 ml solution Take 15-30 mLs by mouth every 4 (four) hours as needed for moderate pain or severe pain. 473 mL 0  . Hyprom-Naphaz-Polysorb-Zn Sulf (CLEAR EYES COMPLETE OP) Apply 1 drop to eye 3 (three) times daily as needed (Dry, irritated eyes).    . promethazine (PHENERGAN) 12.5 MG tablet Take 0.5-1 tablets (6.25-12.5 mg total) by mouth every 6 (six) hours as needed for nausea. For nausea 20 tablet 10   No facility-administered medications prior to visit.    Past Medical History:  Diagnosis Date  . Anxiety   . Choledocholithiasis with chronic cholecystitis 08/13/2011  . Depression   . GERD (gastroesophageal reflux disease)   . History of hiatal hernia    PARAESOPHAGEAL  . HOH (hard of hearing) BOTH EARS,HAS HEARING AIDES  . Hyperlipidemia   . Migraines    Past Surgical History:  Procedure Laterality Date  . ABDOMINAL HYSTERECTOMY     partial, RIGHT OVARY REMOVED  . CHOLECYSTECTOMY  09/03/11  . CYSTECTOMY    . ERCP  09/04/2011   Procedure: ENDOSCOPIC RETROGRADE CHOLANGIOPANCREATOGRAPHY (ERCP);  Surgeon: Graylin Shiver, MD;  Location: [facility];  Service: Endoscopy;  Laterality: N/A;  . ESOPHAGEAL MANOMETRY N/A 05/07/2014   Procedure: ESOPHAGEAL MANOMETRY (EM);  Surgeon: Dorena Cookey, MD;  Location: [facility];  Service: Endoscopy;  Laterality: N/A;  . INTRAOPERATIVE CHOLANGIOGRAM  09/03/2011   Procedure: INTRAOPERATIVE CHOLANGIOGRAM;  Surgeon: [name], MD;  Location: [facility];  Service: General;;  . NASAL SEPTUM SURGERY    . TONSILLECTOMY    . TUBAL LIGATION     Social History   Social History  . Marital status: Married    Spouse name: N/A  . Number of children: N/A  . Years of education: N/A   Social History Main Topics  . Smoking status: Never Smoker  . Smokeless tobacco: Never Used  . Alcohol use No  . Drug use: No  . Sexual activity: Not Asked   Other  Topics Concern  . None   Social History Narrative  . None   Family History  Problem Relation Age of Onset  . Cancer Mother     breast  . Cancer Paternal Grandmother     colon  . Cancer Paternal Grandfather     olon      Review of Systems: Pertinent positive and negative review of systems were noted in the above HPI section. All other review of systems were otherwise negative.   Physical Exam: General: Well developed, well nourished, no acute distress Head: Normocephalic and atraumatic Eyes:  sclerae anicteric, EOMI Ears: Normal auditory acuity Mouth: No deformity or lesions Neck: Supple, no masses or thyromegaly Lungs: Clear throughout to auscultation Heart: Regular rate and rhythm; no murmurs, rubs or bruits Abdomen: Soft, non tender and non distended. No masses, hepatosplenomegaly or hernias noted. Normal Bowel sounds Rectal: not done Musculoskeletal: Symmetrical with no gross deformities  Skin: No lesions on visible extremities Pulses:  Normal pulses noted Extremities: No clubbing, cyanosis, edema or deformities noted Neurological: Alert oriented x 4, grossly nonfocal Cervical Nodes:  No significant cervical adenopathy Inguinal Nodes: No significant inguinal adenopathy Psychological:  Alert and cooperative. Anxious.   Assessment and Recommendations:  1. Dysphagia to liquids and solids that has been persistent since repair of an incarcerated paraesophageal hernia and Nissen fundoplication performed in May 2017. UGI series showing a 13 mm barium tablet would not pass the GE junction. Maximum channel opening was 6 mm. Prominent volume of partially digested food in the stomach noted. R/O gastroparesis. R/O intrinsic esophageal stricture, tight wrap. Recommend patient proceed with gastric emptying study and office appointment with Dr. Michaell Cowing as scheduled. Schedule EGD. The risks (including bleeding, perforation, infection, missed lesions, medication reactions and possible  hospitalization or surgery if complications occur), benefits, and alternatives to endoscopy with possible biopsy and possible dilation were discussed with the patient and they consent to proceed.   2. Small right upper quadrant ventral hernia. Follow-up with Dr. Michaell Cowing.  3. CRC screening, average risk. Colonoscopy in 02/2024.

## 2016-05-05 ENCOUNTER — Telehealth: Payer: Self-pay | Admitting: Gastroenterology

## 2016-05-05 NOTE — Telephone Encounter (Signed)
No charge this time. 

## 2016-05-06 ENCOUNTER — Telehealth: Payer: Self-pay | Admitting: Gastroenterology

## 2016-05-06 NOTE — Telephone Encounter (Signed)
Received from [facility] forwarded 21 pages to Dr. Claudette Head.

## 2016-05-07 ENCOUNTER — Encounter: Payer: Managed Care, Other (non HMO) | Admitting: Gastroenterology

## 2016-05-15 ENCOUNTER — Telehealth: Payer: Self-pay | Admitting: Gastroenterology

## 2016-05-15 NOTE — Telephone Encounter (Signed)
I explained to the patient that she had a large amount of food in her stomach despite being NPO. All questions answered.  She will call back for any additional questions or concerms

## 2016-05-19 ENCOUNTER — Ambulatory Visit (HOSPITAL_COMMUNITY)
Admission: RE | Admit: 2016-05-19 | Discharge: 2016-05-19 | Disposition: A | Discharge status: Home / Self Care | Payer: Managed Care, Other (non HMO) | Source: Ambulatory Visit | Attending: Surgery | Admitting: Surgery

## 2016-05-19 DIAGNOSIS — R4702 Dysphasia: Secondary | ICD-10-CM | POA: Insufficient documentation

## 2016-05-19 MED ORDER — TECHNETIUM TC 99M SULFUR COLLOID: 2.0000 | Freq: Once | INTRAVENOUS | Status: DC | PRN

## 2016-05-28 ENCOUNTER — Encounter: Payer: Self-pay | Admitting: Gastroenterology

## 2016-05-28 ENCOUNTER — Ambulatory Visit (AMBULATORY_SURGERY_CENTER): Payer: Managed Care, Other (non HMO) | Admitting: Gastroenterology

## 2016-05-28 VITALS — BP 128/62 | HR 64 | Temp 97.8°F | Resp 11 | Ht 59.0 in | Wt 108.0 lb

## 2016-05-28 DIAGNOSIS — R1310 Dysphagia, unspecified: Secondary | ICD-10-CM

## 2016-05-28 DIAGNOSIS — R933 Abnormal findings on diagnostic imaging of other parts of digestive tract: Secondary | ICD-10-CM

## 2016-05-28 DIAGNOSIS — R1319 Other dysphagia: Secondary | ICD-10-CM

## 2016-05-28 MED ORDER — SODIUM CHLORIDE 0.9 % IV SOLN: 500.0000 mL | INTRAVENOUS | Status: DC

## 2016-05-28 NOTE — Op Note (Signed)
[facility] Patient Name: Cheryl Fields Procedure Date: 05/28/2016 9:57 AM MRN: [id number] Endoscopist: Meryl Dare , MD Age: 56 Referring MD:  Date of Birth: 1960-08-23 Gender: Female Account #: [id number] Procedure:                Upper GI endoscopy Indications:              Dysphagia Medicines:                Monitored Anesthesia Care Procedure:                Pre-Anesthesia Assessment:                           - Prior to the procedure, a History and Physical                            was performed, and patient medications and                            allergies were reviewed. The patient's tolerance of                            previous anesthesia was also reviewed. The risks                            and benefits of the procedure and the sedation                            options and risks were discussed with the patient.                            All questions were answered, and informed consent                            was obtained. Prior Anticoagulants: The patient has                            taken no previous anticoagulant or antiplatelet                            agents. ASA Grade Assessment: II - A patient with                            mild systemic disease. After reviewing the risks                            and benefits, the patient was deemed in                            satisfactory condition to undergo the procedure.                           After obtaining informed consent, the endoscope was  passed under direct vision. Throughout the                            procedure, the patient's blood pressure, pulse, and                            oxygen saturations were monitored continuously. The                            Model GIF-[id number]) scope was introduced                            through the mouth, and advanced to the second part                            of duodenum. The upper GI endoscopy was                             accomplished without difficulty. The patient                            tolerated the procedure well. Scope In: Scope Out: Findings:                 Evidence of a Nissen fundoplication was found in                            the distal esophagus. The wrap appeared tight. This                            was traversed.                           The proximal esophagus and mid esophagus were                            normal.                           Evidence of a Nissen fundoplication was found in                            the cardia and in the gastric fundus. The wrap                            appeared tight. This was traversed.                           A medium amount of food (residue) was found in the                            gastric body and in the gastric antrum.                           The exam of the stomach was otherwise normal.  The duodenal bulb and second portion of the                            duodenum were normal. Complications:            No immediate complications. Estimated Blood Loss:     Estimated blood loss: none. Impression:               - A Nissen fundoplication was found in the distal                            esophagus. The wrap appears tight.                           - Normal proximal esophagus and mid esophagus.                           - A Nissen fundoplication was found in the gastric                            cardia and fundus. The wrap appears tight.                           - A medium amount of food (residue) in the stomach.                           - Normal duodenal bulb and second portion of the                            duodenum.                           - No specimens collected. Recommendation:           - Patient has a contact number available for                            emergencies. The signs and symptoms of potential                            delayed complications were discussed  with the                            patient. Return to normal activities tomorrow.                            Written discharge instructions were provided to the                            patient.                           - Resume previous diet.                           - Continue present medications.                           -  Follow up with Dr. Michaell CowingGross. Meryl DareMalcolm T Salma Walrond, MD 05/28/2016 10:25:57 AM This report has been signed electronically.

## 2016-05-28 NOTE — Progress Notes (Signed)
To PACU, vss patent aw report to rn 

## 2016-05-28 NOTE — Progress Notes (Signed)
Pt's states no medical or surgical changes since previsit or office visit. 

## 2016-05-28 NOTE — Patient Instructions (Signed)
YOU HAD AN ENDOSCOPIC PROCEDURE TODAY AT THE [facility]:   Refer to the procedure report that was given to you for any specific questions about what was found during the examination.  If the procedure report does not answer your questions, please call your gastroenterologist to clarify.  If you requested that your care partner not be given the details of your procedure findings, then the procedure report has been included in a sealed envelope for you to review at your convenience later.  YOU SHOULD EXPECT: Some feelings of bloating in the abdomen. Passage of more gas than usual.  Walking can help get rid of the air that was put into your GI tract during the procedure and reduce the bloating. If you had a lower endoscopy (such as a colonoscopy or flexible sigmoidoscopy) you may notice spotting of blood in your stool or on the toilet paper. If you underwent a bowel prep for your procedure, you may not have a normal bowel movement for a few days.  Please Note:  You might notice some irritation and congestion in your nose or some drainage.  This is from the oxygen used during your procedure.  There is no need for concern and it should clear up in a day or so.  SYMPTOMS TO REPORT IMMEDIATELY:    Following upper endoscopy (EGD)  Vomiting of blood or coffee ground material  New chest pain or pain under the shoulder blades  Painful or persistently difficult swallowing  New shortness of breath  Fever of 100F or higher  Black, tarry-looking stools  For urgent or emergent issues, a gastroenterologist can be reached at any hour by calling [phone].    DIET:  We do recommend a small meal at first, but then you may proceed to your regular diet.  Drink plenty of fluids but you should avoid alcoholic beverages for 24 hours.  ACTIVITY:  You should plan to take it easy for the rest of today and you should NOT DRIVE or use heavy machinery until tomorrow (because of the sedation medicines  used during the test).    FOLLOW UP: Our staff will call the number listed on your records the next business day following your procedure to check on you and address any questions or concerns that you may have regarding the information given to you following your procedure. If we do not reach you, we will leave a message.  However, if you are feeling well and you are not experiencing any problems, there is no need to return our call.  We will assume that you have returned to your regular daily activities without incident.  If any biopsies were taken you will be contacted by phone or by letter within the next 1-3 weeks.  Please call us at [phone] if you have not heard about the biopsies in 3 weeks.    SIGNATURES/CONFIDENTIALITY: You and/or your care partner have signed paperwork which will be entered into your electronic medical record.  These signatures attest to the fact that that the information above on your After Visit Summary has been reviewed and is understood.  Full responsibility of the confidentiality of this discharge information lies with you and/or your care-partner.  Thank you for letting us take care of your healthcare needs today. 

## 2016-05-29 ENCOUNTER — Telehealth: Payer: Self-pay | Admitting: *Deleted

## 2016-05-29 NOTE — Telephone Encounter (Signed)
  Follow up Call-  Call back number 05/28/2016  Post procedure Call Back phone  # [phone]  Permission to leave phone message Yes  Some recent data might be hidden     Patient questions:  Do you have a fever, pain , or abdominal swelling? No. Pain Score  0 *  Have you tolerated food without any problems? Yes.    Have you been able to return to your normal activities? Yes.    Do you have any questions about your discharge instructions: Diet   No. Medications  No. Follow up visit  No.  Do you have questions or concerns about your Care? No.  Actions: * If pain score is 4 or above: No action needed, pain <4.

## 2016-06-23 ENCOUNTER — Ambulatory Visit: Payer: Self-pay | Admitting: Surgery

## 2016-06-23 NOTE — H&P (Signed)
Cheryl Fields 06/01/2016 3:54 PM Location: [facility] Patient #: [id number] DOB: 12/04/1960 Married / Language: English / Race: White Female   History of Present Illness (Jazmen Lindenbaum C. Kylyn Mcdade MD; 06/01/2016 4:18 PM) The patient is a 56 year old female who presents with dysphagia. Note for "Dysphagia": The patient returns status post robotically-assisted reduction repair/closure of paraesophageal hiatal hernia with Nissen fundoplication 05/29/2015.   She is 1 year status post surgery. She underwent upper endoscopy after some convincing. Wrap was tight but passed scope across it. Strictured. Teen food in stomach suspicious for delayed gastric emptying. Patient eventually convinced to have gastric emptying study that came back surprisingly normal. She still constipated. Moving her bowels about twice a week. She still on Benefiber. Has not switched things. Patient notes that she mainly gets bloated. Hard to belch. No problems with being able to have flatulence. She will get dysphagia to some solids 2 or 3 times a week. We'll past with glass of liquid. No severe emesis or retching. Her weight remains stable around 104-106. Not declining but just disappointed she's not totally normal.    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 Type II mediastinal dissection. 3. Primary repair of hiatal hernia over pledgets. 4. Mesh reinforcement with biologic BioA Gore mesh 4. Anterior & posterior gastropexy. 5. Nissen fundoplication 2 cm over a 56-French bougie  SURGEON: Asante Ritacco Harshitha Fretz, MD  OR FINDINGS:  Moderate-sized paraesophageal hiatal hernia with 40% of the stomach in the  mediastinum, mostly left sided. There was a 6x8cm hiatal defect.  It is a primary repair over pledgets. [place] mesh reinforcement with biologic mesh (Cook Biodesign 7x10cm mesh cut to a "U" shape)  The patient has a 2 cm Nissen fundoplication that was done over 56-French bougie. The patient has had anterior and posterior gastropexy.   Problem List/Past Medical (Tanijah Morais C. Lulani Bour, MD; 06/01/2016 4:09 PM) PARAESOPHAGEAL HIATAL HERNIA (K44.9)  CHEST PAIN (R07.9)  HEARTBURN (R12)  DYSPHASIA (R47.02)  INCISIONAL HERNIA, WITHOUT OBSTRUCTION OR GANGRENE (K43.2)  CHRONIC CONSTIPATION (K59.09)   Past Surgical History (Brooks Stotz C. Keeva Reisen, MD; 06/01/2016 4:09 PM) Gallbladder Surgery - Laparoscopic  Hysterectomy (not due to cancer) - Partial  Tonsillectomy   Diagnostic Studies History (Loraina Stauffer C. Ulysses Alper, MD; 06/01/2016 4:09 PM) Colonoscopy  within last year Mammogram  1-3 years ago  Allergies (Chalyn Amescua C. Viraat Vanpatten, MD; 06/01/2016 4:09 PM) Voltaren *DERMATOLOGICALS*  ASA Arthritis Strength/Antacid *ANALGESICS - NonNarcotic*  Allergies Reconciled   Medication History (Alisha Spillers, CMA; 06/01/2016 3:56 PM) Methocarbamol (500MG Tablet, Oral) Active. Medications Reconciled  Social History (Loriana Samad C. Shalom Ware, MD; 06/01/2016 4:09 PM) Caffeine use  Carbonated beverages, Coffee, Tea. No alcohol use  No drug use  Tobacco use  Never smoker.  Family History (Arelie Kuzel C. Tenleigh Byer, MD; 06/01/2016 4:09 PM) Breast Cancer  Mother. Depression  Mother. Heart Disease  Mother. Migraine Headache  Mother.  Pregnancy / Birth History (Kaeleen Odom C. Jahbari Repinski, MD; 06/01/2016 4:09 PM) Age at menarche  13 years. Age of menopause  <45 Gravida  2 Maternal age  15-20 Para  1  Other Problems (Rola Lennon C. Etheridge Geil, MD; 06/01/2016 4:09 PM) Anxiety Disorder  Back Pain  Depression  Gastroesophageal Reflux Disease  Hemorrhoids  Hypercholesterolemia  Migraine Headache     Review of Systems ( C.  , MD; 06/01/2016 4:9 PM) General   Present- Fatigue. Not Present- Appetite Loss, Chills, Fever, Night Sweats, Weight Gain and Weight Loss. Skin Present- Dryness. Not Present- Change in Wart/Mole, Hives, Jaundice, New Lesions, Non-Healing Wounds, Rash and Ulcer. HEENT Present- Hearing Loss, Ringing in the Ears, Visual Disturbances and Wears glasses/contact lenses. Not Present- Earache, Hoarseness, Nose Bleed, Oral Ulcers, Seasonal Allergies, Sinus Pain, Sore Throat and Yellow Eyes. Respiratory Not Present- Bloody sputum, Chronic Cough, Difficulty Breathing, Snoring and Wheezing. Breast Present- Breast Mass. Not Present- Breast Pain, Nipple Discharge and Skin Changes. Cardiovascular Not Present- Chest Pain, Difficulty Breathing Lying Down, Leg Cramps, Palpitations, Rapid Heart Rate, Shortness of Breath and Swelling of Extremities. Gastrointestinal Present- Abdominal Pain, Bloating, Excessive gas, Gets full quickly at meals, Hemorrhoids, Indigestion, Nausea and Vomiting. Not Present- Bloody Stool, Change in Bowel Habits, Chronic diarrhea, Constipation, Difficulty Swallowing and Rectal Pain. Female Genitourinary Not Present- Frequency, Nocturia, Painful Urination, Pelvic Pain and Urgency. Musculoskeletal Present- Back Pain, Joint Pain and Joint Stiffness. Not Present- Muscle Pain, Muscle Weakness and Swelling of Extremities. Neurological Present- Headaches. Not Present- Decreased Memory, Fainting, Numbness, Seizures, Tingling, Tremor, Trouble walking and Weakness. Psychiatric Present- Anxiety and Depression. Not Present- Bipolar, Change in Sleep Pattern, Fearful and Frequent crying. Endocrine Not Present- Cold Intolerance, Excessive Hunger, Hair Changes, Heat Intolerance, Hot flashes and New Diabetes. Hematology Present- Easy Bruising. Not Present- Excessive bleeding, Gland problems, HIV and Persistent Infections.  Vitals (Alisha Spillers CMA; 06/01/2016 3:55 PM) 06/01/2016 3:55 PM Weight: 106.8 lb  Height: 58in Body Surface Area: 1.4 m Body Mass Index: 22.32 kg/m  Pulse: 80 (Regular)  BP: 130/68 (Sitting, Left Arm, Standard)       Physical Exam (Joliene Salvador C. Vicci Reder MD; 06/01/2016 4:19 PM) General Mental Status-Alert. General Appearance-Not in acute distress, Not Sickly. Orientation-Oriented X3. Hydration-Well hydrated. Voice-Normal. Note: Thin but not cachectic.   Integumentary Global Assessment Normal Exam - Axillae: non-tender, no inflammation or ulceration, no drainage. and Distribution of scalp and body hair is normal. General Characteristics Temperature - normal warmth is noted.  Head and Neck Head-normocephalic, atraumatic with no lesions or palpable masses. Face Global Assessment - atraumatic, no absence of expression. Neck Global Assessment - no abnormal movements, no bruit auscultated on the right, no bruit auscultated on the left, no decreased range of motion, non-tender. Trachea-midline. Thyroid Gland Characteristics - non-tender.  Eye Eyeball - Left-Extraocular movements intact, No Nystagmus. Eyeball - Right-Extraocular movements intact, No Nystagmus. Cornea - Left-No Hazy. Cornea - Right-No Hazy. Sclera/Conjunctiva - Left-No scleral icterus, No Discharge. Sclera/Conjunctiva - Right-No scleral icterus, No Discharge. Pupil - Left-Direct reaction to light normal. Pupil - Right-Direct reaction to light normal. Note: Continues to wear glasses.   ENMT Ears Pinna - Left - no drainage observed, no generalized tenderness observed. Right - no drainage observed, no generalized tenderness observed. Nose and Sinuses Nose - no destructive lesion observed. Nares - Left - quiet respiration. Right - quiet respiration. Mouth and Throat Lips - Upper Lip - no fissures observed, no pallor noted. Lower Lip - no fissures observed, no pallor noted. Nasopharynx - no discharge present. Oral Cavity/Oropharynx - Tongue - no dryness  observed. Oral Mucosa - no cyanosis observed. Hypopharynx - no evidence of airway distress observed.  Chest and Lung Exam Inspection Movements - Normal and Symmetrical. Accessory muscles - No use of accessory muscles in breathing. Palpation Normal exam - Non-tender. Auscultation Breath sounds - Normal and Clear. Note: Clear. Mild soreness on lateral rib cages.   Cardiovascular Auscultation Rhythm - Regular. Murmurs & Other Heart Sounds - Normal   exam - No Murmurs and No Systolic Clicks.  Abdomen Inspection Normal Exam - No Visible peristalsis and No Abnormal pulsations. Umbilicus - No Bleeding, No Urine drainage. Palpation/Percussion Normal exam - Soft, Non Tender, No Rebound tenderness, No Rigidity (guarding) and No Cutaneous hyperesthesia. Note: 2 cm right lateral subcutaneous mass reducible consistent with small incisional hernia at the right lateral buttock port site.   Abdomen soft and flat. Periumbilical incision. No hernia there. Nontender. Nondistended. No guarding. No peritonitis.   Female Genitourinary Sexual Maturity Tanner 5 - Adult hair pattern. Note: No vaginal bleeding nor discharge. No lymphadenopathy. No inguinal hernias.   Peripheral Vascular Upper Extremity Inspection - Left - No Cyanotic nailbeds, Not Ischemic. Right - No Cyanotic nailbeds, Not Ischemic.  Neurologic Neurologic evaluation reveals -normal attention span and ability to concentrate, able to name objects and repeat phrases. Appropriate fund of knowledge , normal sensation and normal coordination. Mental Status Affect - not angry, not paranoid. Cranial Nerves-Normal Bilaterally. Gait-Normal.  Neuropsychiatric Mental status exam performed with findings of-able to articulate well with normal speech/language, rate, volume and coherence, thought content normal with ability to perform basic computations and apply abstract reasoning and no evidence of hallucinations, delusions,  obsessions or homicidal/suicidal ideation.  Musculoskeletal Global Assessment Spine, Ribs and Pelvis - no instability, subluxation or laxity. Right Upper Extremity - no instability, subluxation or laxity.  Lymphatic Head & Neck General Head & Neck Lymphatics: Bilateral - Description - No Localized lymphadenopathy. Axillary General Axillary Region: Bilateral - Description - No Localized lymphadenopathy. Femoral & Inguinal Generalized Femoral & Inguinal Lymphatics: Left: Right - Description - No Localized lymphadenopathy. Description - No Localized lymphadenopathy.    Assessment & Plan (Navah Grondin C. Laurieanne Galloway MD; 06/01/2016 4:21 PM) POSTPRANDIAL BLOATING (R14.0) Impression: Bloating and constipation suspicious for delayed gastric emptying. Retained food suspicious for gastric emptying delays. However gastric emptying study not particularly abnormal.  Given the persistent symptoms, I think she would benefit with a trial of metoclopramide QAC/QHS See if that helps with some of her bloating and other issues. Hopefully if we can get her constipation control, that would help as well Current Plans Pt Education - [facility] Free Text Education/Instructions: discussed with patient and provided information. Started Reglan 5MG, 1 (one) Tablet before every meal, #40, 06/01/2016, Ref. x2. DYSPHASIA (R47.02) Impression: Some dysphagia most likely related to the wrapping somewhat tight. Can tolerate liquids fine. Scope went across it.  Gas-X/Mylanta/Beano as needed for gassiness. Avoid carbonation. Again focus on smaller more frequent meals.  If her stomach issues and abdominal complaints are under better control, and she still struggles with symptoms; then, offer EGD with balloon or savory wire dilation a few times with Kenalog injections. See if that can help soften and open up the wrap a little bit.  The anatomy and the physiology was discussed. The pathophysiology and natural history of the disease was  discussed. Options were discussed and recommendations were made.  Technique, risks, benefits, & alternatives were discussed. Questions answered.  The patient agrees to proceed.   She may benefit from repeat manometry to rule out dysmotility. Not have weak peristalsis on preoperative evaluation. There was concerned that it was may be somewhat hyperactive. Did not seem classic for nutcracker or diffuse esophageal spasm, but it may be helpful to figure out if she has reproduction of pain with dysmotility.    If constipation controlled, stomach emptying better, and she still struggles with poor dysphagia; then, could offer takedown and redo of fundoplication to more of a Toupet.   Repeat Manometry 1st. Consiider pyloroplasty if still having some delayed stomach emptying issues. Current Plans Pt Education - [facility] Esophageal Surgery Diet HCI (Tatem Holsonback): discussed with patient and provided information. CHRONIC CONSTIPATION (K59.09) Impression: I definitely recommend a fiber supplement.  She is on Benefiber. Given her episodes of bloating and crampiness, I would pick a different fiber supplement. Try flaxseed or MiraLAX. See if that works better for her.  Again noted that no one with constipation does not struggle with occasional bloating or discomfort. Correcting the constipation should definitely help with her abdominal complaints Current Plans Pt Education - [facility] Constipation (AT) Pt Education - [facility] Good Bowel Health (Urbano Milhouse) INCISIONAL HERNIA, WITHOUT OBSTRUCTION OR GANGRENE (K43.2) Impression: Probable small incisional hernia at one of the right lateral robotic 8mm port site. 2 cm mass easily reducible. Does not seem to be likely a source of major pain or discomfort.  Reasonable to have it repaired laparoscopically with underlay mesh. Likely to hold off on any surgery intervention until rest of the workup is complete. She's more curious then really symptomatic from it.  Does not do well from a pain and  recovery standpoint with any operation, especially with a hernia operation. She did not seem severely symptomatic for her. Just is more curious. She wants to hold off on any intervention at this time Current Plans Follow up if no improvement or if symptoms worsen  Xaviar Lunn C. Delia Sitar, M.D., F.A.C.S. Gastrointestinal and Minimally Invasive Surgery [facility] [address], [address] [place], [place] [address] [phone] Main / Paging   

## 2016-07-07 ENCOUNTER — Encounter (HOSPITAL_COMMUNITY): Payer: Self-pay | Admitting: *Deleted

## 2016-07-09 ENCOUNTER — Encounter (HOSPITAL_COMMUNITY)
Admission: RE | Disposition: A | Discharge status: Home / Self Care | Payer: Self-pay | Source: Ambulatory Visit | Attending: Surgery

## 2016-07-09 ENCOUNTER — Ambulatory Visit (HOSPITAL_COMMUNITY)
Admission: RE | Admit: 2016-07-09 | Discharge: 2016-07-09 | Disposition: A | Discharge status: Home / Self Care | Payer: Managed Care, Other (non HMO) | Source: Ambulatory Visit | Attending: Surgery | Admitting: Surgery

## 2016-07-09 ENCOUNTER — Ambulatory Visit (HOSPITAL_COMMUNITY): Payer: Managed Care, Other (non HMO) | Admitting: Anesthesiology

## 2016-07-09 ENCOUNTER — Encounter (HOSPITAL_COMMUNITY): Payer: Self-pay | Admitting: *Deleted

## 2016-07-09 DIAGNOSIS — F419 Anxiety disorder, unspecified: Secondary | ICD-10-CM | POA: Insufficient documentation

## 2016-07-09 DIAGNOSIS — R51 Headache: Secondary | ICD-10-CM | POA: Diagnosis not present

## 2016-07-09 DIAGNOSIS — K30 Functional dyspepsia: Secondary | ICD-10-CM

## 2016-07-09 DIAGNOSIS — K449 Diaphragmatic hernia without obstruction or gangrene: Secondary | ICD-10-CM

## 2016-07-09 DIAGNOSIS — R1310 Dysphagia, unspecified: Secondary | ICD-10-CM

## 2016-07-09 DIAGNOSIS — F329 Major depressive disorder, single episode, unspecified: Secondary | ICD-10-CM | POA: Diagnosis not present

## 2016-07-09 DIAGNOSIS — Z79890 Hormone replacement therapy (postmenopausal): Secondary | ICD-10-CM | POA: Diagnosis not present

## 2016-07-09 DIAGNOSIS — Z79899 Other long term (current) drug therapy: Secondary | ICD-10-CM | POA: Diagnosis not present

## 2016-07-09 DIAGNOSIS — Z9889 Other specified postprocedural states: Secondary | ICD-10-CM | POA: Diagnosis not present

## 2016-07-09 HISTORY — PX: ESOPHAGOGASTRODUODENOSCOPY (EGD) WITH PROPOFOL: SHX5813

## 2016-07-09 SURGERY — ESOPHAGOGASTRODUODENOSCOPY (EGD) WITH PROPOFOL
Anesthesia: Monitor Anesthesia Care

## 2016-07-09 MED ORDER — TRIAMCINOLONE ACETONIDE 40 MG/ML IJ SUSP: 40.0000 mg | Freq: Once | INTRAMUSCULAR | Status: DC

## 2016-07-09 MED ORDER — ONDANSETRON HCL 4 MG/2ML IJ SOLN
INTRAMUSCULAR | Status: AC
  Filled 2016-07-09: qty 2

## 2016-07-09 MED ORDER — LACTATED RINGERS IV SOLN
INTRAVENOUS | Status: DC
  Administered 2016-07-09: 1000 mL via INTRAVENOUS

## 2016-07-09 MED ORDER — LIDOCAINE 2% (20 MG/ML) 5 ML SYRINGE
INTRAMUSCULAR | Status: AC
  Filled 2016-07-09: qty 5

## 2016-07-09 MED ORDER — TRIAMCINOLONE ACETONIDE 40 MG/ML IJ SUSP
INTRAMUSCULAR | Status: DC | PRN
  Administered 2016-07-09: 40 mg via INTRAMUSCULAR

## 2016-07-09 MED ORDER — PROPOFOL 10 MG/ML IV BOLUS
INTRAVENOUS | Status: DC | PRN
  Administered 2016-07-09 (×3): 20 mg via INTRAVENOUS
  Administered 2016-07-09: 30 mg via INTRAVENOUS
  Administered 2016-07-09 (×8): 20 mg via INTRAVENOUS

## 2016-07-09 MED ORDER — SODIUM CHLORIDE 0.9 % IV SOLN: INTRAVENOUS | Status: DC

## 2016-07-09 MED ORDER — FENTANYL CITRATE (PF) 100 MCG/2ML IJ SOLN
INTRAMUSCULAR | Status: AC
Start: 2016-07-09 — End: 2016-07-09
  Filled 2016-07-09: qty 2

## 2016-07-09 MED ORDER — ONDANSETRON HCL 4 MG/2ML IJ SOLN
INTRAMUSCULAR | Status: DC | PRN
  Administered 2016-07-09: 4 mg via INTRAVENOUS

## 2016-07-09 MED ORDER — FENTANYL CITRATE (PF) 100 MCG/2ML IJ SOLN
25.0000 ug | Freq: Once | INTRAMUSCULAR | Status: AC
  Administered 2016-07-09: 25 ug via INTRAVENOUS

## 2016-07-09 MED ORDER — PROPOFOL 10 MG/ML IV BOLUS
INTRAVENOUS | Status: AC
  Filled 2016-07-09: qty 40

## 2016-07-09 MED ORDER — LIDOCAINE 2% (20 MG/ML) 5 ML SYRINGE
INTRAMUSCULAR | Status: DC | PRN
  Administered 2016-07-09: 60 mg via INTRAVENOUS

## 2016-07-09 MED ORDER — METOCLOPRAMIDE HCL 10 MG PO TABS: 5.0000 mg | ORAL_TABLET | Freq: Three times a day (TID) | ORAL | 2 refills | Status: AC

## 2016-07-09 MED ORDER — TRIAMCINOLONE ACETONIDE 40 MG/ML IJ SUSP
INTRAMUSCULAR | Status: AC
  Filled 2016-07-09: qty 1

## 2016-07-09 NOTE — Interval H&P Note (Signed)
History and Physical Interval Note:  07/09/2016 12:57 PM  Cheryl [name] PlanFrances D [name]  has presented today for surgery, with the diagnosis of history of fundoplication with dysphagia  The various methods of treatment have been discussed with the patient and family. After consideration of risks, benefits and other options for treatment, the patient has consented to  Procedure(s) with comments: ESOPHAGOGASTRODUODENOSCOPY (EGD) WITH PROPOFOL (N/A) - balloon dilation as a surgical intervention .  The patient's history has been reviewed, patient examined, no change in status, stable for surgery.  I have reviewed the patient's chart and labs.  Questions were answered to the patient's satisfaction.     [name] C.

## 2016-07-09 NOTE — H&P (View-Only) (Signed)
Cheryl [name] 06/01/2016 3:54 PM Location: [facility] Patient #: [id number] DOB: Feb 11, 1960 Married / Language: English / Race: White Female   History of Present Illness [name] MD; 06/01/2016 4:18 PM) The patient is a 55 year old female who presents with dysphagia. Note for "Dysphagia": The patient returns status post robotically-assisted reduction repair/closure of paraesophageal hiatal hernia with Nissen fundoplication 05/29/2015.   She is 1 year status post surgery. She underwent upper endoscopy after some convincing. Wrap was tight but passed scope across it. Strictured. Teen food in stomach suspicious for delayed gastric emptying. Patient eventually convinced to have gastric emptying study that came back surprisingly normal. She still constipated. Moving her bowels about twice a week. She still on Benefiber. Has not switched things. Patient notes that she mainly gets bloated. Hard to belch. No problems with being able to have flatulence. She will get dysphagia to some solids 2 or 3 times a week. We'll past with glass of liquid. No severe emesis or retching. Her weight remains stable around 104-106. Not declining but just disappointed she's not totally normal.    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 Type II mediastinal dissection. 3. Primary repair of hiatal hernia over pledgets. 4. Mesh reinforcement with biologic BioA Gore mesh 4. Anterior & posterior gastropexy. 5. Nissen fundoplication 2 cm over a 56-French bougie  SURGEON: Karie Soda, MD  OR FINDINGS:  Moderate-sized paraesophageal hiatal hernia with 40% of the stomach in the  mediastinum, mostly left sided. There was a 6x8cm hiatal defect.  It is a primary repair over pledgets. [place] mesh reinforcement with biologic mesh Adriana Simas Biodesign 7x10cm mesh cut to a "U" shape)  The patient has a 2 cm Nissen fundoplication that was done over 56-French bougie. The patient has had anterior and posterior gastropexy.   Problem List/Past Medical [name], MD; 06/01/2016 4:09 PM) PARAESOPHAGEAL HIATAL HERNIA (K44.9)  CHEST PAIN (R07.9)  HEARTBURN (R12)  DYSPHASIA (R47.02)  INCISIONAL HERNIA, WITHOUT OBSTRUCTION OR GANGRENE (K43.2)  CHRONIC CONSTIPATION (K59.09)   Past Surgical History [name], MD; 06/01/2016 4:09 PM) Gallbladder Surgery - Laparoscopic  Hysterectomy (not due to cancer) - Partial  Tonsillectomy   Diagnostic Studies History [name], MD; 06/01/2016 4:09 PM) Colonoscopy  within last year Mammogram  1-3 years ago  Allergies [name], MD; 06/01/2016 4:09 PM) Voltaren *DERMATOLOGICALS*  ASA Arthritis Strength/Antacid *ANALGESICS - NonNarcotic*  Allergies Reconciled   Medication History (Alisha Spillers, CMA; 06/01/2016 3:56 PM) Methocarbamol (500MG  Tablet, Oral) Active. Medications Reconciled  Social History [name], MD; 06/01/2016 4:09 PM) Caffeine use  Carbonated beverages, Coffee, Tea. No alcohol use  No drug use  Tobacco use  Never smoker.  Family History [name], MD; 06/01/2016 4:09 PM) Breast Cancer  Mother. Depression  Mother. Heart Disease  Mother. Migraine Headache  Mother.  Pregnancy / Birth History [name], MD; 06/01/2016 4:09 PM) Age at menarche  13 years. Age of menopause  <43 Gravida  2 Maternal age  47-20 Para  1  Other Problems [name], MD; 06/01/2016 4:09 PM) Anxiety Disorder  Back Pain  Depression  Gastroesophageal Reflux Disease  Hemorrhoids  Hypercholesterolemia  Migraine Headache     Review of Systems [name], MD; 06/01/2016 4:9 PM) General  Present- Fatigue. Not Present- Appetite Loss, Chills, Fever, Night Sweats, Weight Gain and Weight Loss. Skin Present- Dryness. Not Present- Change in Wart/Mole, Hives, Jaundice, New Lesions, Non-Healing Wounds, Rash and Ulcer. HEENT Present- Hearing Loss, Ringing in the Ears, Visual Disturbances and Wears glasses/contact lenses. Not Present- Earache, Hoarseness, Nose Bleed, Oral Ulcers, Seasonal Allergies, Sinus Pain, Sore Throat and Yellow Eyes. Respiratory Not Present- Bloody sputum, Chronic Cough, Difficulty Breathing, Snoring and Wheezing. Breast Present- Breast Mass. Not Present- Breast Pain, Nipple Discharge and Skin Changes. Cardiovascular Not Present- Chest Pain, Difficulty Breathing Lying Down, Leg Cramps, Palpitations, Rapid Heart Rate, Shortness of Breath and Swelling of Extremities. Gastrointestinal Present- Abdominal Pain, Bloating, Excessive gas, Gets full quickly at meals, Hemorrhoids, Indigestion, Nausea and Vomiting. Not Present- Bloody Stool, Change in Bowel Habits, Chronic diarrhea, Constipation, Difficulty Swallowing and Rectal Pain. Female Genitourinary Not Present- Frequency, Nocturia, Painful Urination, Pelvic Pain and Urgency. Musculoskeletal Present- Back Pain, Joint Pain and Joint Stiffness. Not Present- Muscle Pain, Muscle Weakness and Swelling of Extremities. Neurological Present- Headaches. Not Present- Decreased Memory, Fainting, Numbness, Seizures, Tingling, Tremor, Trouble walking and Weakness. Psychiatric Present- Anxiety and Depression. Not Present- Bipolar, Change in Sleep Pattern, Fearful and Frequent crying. Endocrine Not Present- Cold Intolerance, Excessive Hunger, Hair Changes, Heat Intolerance, Hot flashes and New Diabetes. Hematology Present- Easy Bruising. Not Present- Excessive bleeding, Gland problems, HIV and Persistent Infections.  Vitals (Alisha Spillers CMA; 06/01/2016 3:55 PM) 06/01/2016 3:55 PM Weight: 106.8 lb  Height: 58in Body Surface Area: 1.4 m Body Mass Index: 22.32 kg/m  Pulse: 80 (Regular)  BP: 130/68 (Sitting, Left Arm, Standard)       Physical Exam [name] MD; 06/01/2016 4:19 PM) General Mental Status-Alert. General Appearance-Not in acute distress, Not Sickly. Orientation-Oriented X3. Hydration-Well hydrated. Voice-Normal. Note: Thin but not cachectic.   Integumentary Global Assessment Normal Exam - Axillae: non-tender, no inflammation or ulceration, no drainage. and Distribution of scalp and body hair is normal. General Characteristics Temperature - normal warmth is noted.  Head and Neck Head-normocephalic, atraumatic with no lesions or palpable masses. Face Global Assessment - atraumatic, no absence of expression. Neck Global Assessment - no abnormal movements, no bruit auscultated on the right, no bruit auscultated on the left, no decreased range of motion, non-tender. Trachea-midline. Thyroid Gland Characteristics - non-tender.  Eye Eyeball - Left-Extraocular movements intact, No Nystagmus. Eyeball - Right-Extraocular movements intact, No Nystagmus. Cornea - Left-No Hazy. Cornea - Right-No Hazy. Sclera/Conjunctiva - Left-No scleral icterus, No Discharge. Sclera/Conjunctiva - Right-No scleral icterus, No Discharge. Pupil - Left-Direct reaction to light normal. Pupil - Right-Direct reaction to light normal. Note: Continues to wear glasses.   ENMT Ears Pinna - Left - no drainage observed, no generalized tenderness observed. Right - no drainage observed, no generalized tenderness observed. Nose and Sinuses Nose - no destructive lesion observed. Nares - Left - quiet respiration. Right - quiet respiration. Mouth and Throat Lips - Upper Lip - no fissures observed, no pallor noted. Lower Lip - no fissures observed, no pallor noted. Nasopharynx - no discharge present. Oral Cavity/Oropharynx - Tongue - no dryness  observed. Oral Mucosa - no cyanosis observed. Hypopharynx - no evidence of airway distress observed.  Chest and Lung Exam Inspection Movements - Normal and Symmetrical. Accessory muscles - No use of accessory muscles in breathing. Palpation Normal exam - Non-tender. Auscultation Breath sounds - Normal and Clear. Note: Clear. Mild soreness on lateral rib cages.   Cardiovascular Auscultation Rhythm - Regular. Murmurs & Other Heart Sounds - Normal  exam - No Murmurs and No Systolic Clicks.  Abdomen Inspection Normal Exam - No Visible peristalsis and No Abnormal pulsations. Umbilicus - No Bleeding, No Urine drainage. Palpation/Percussion Normal exam - Soft, Non Tender, No Rebound tenderness, No Rigidity (guarding) and No Cutaneous hyperesthesia. Note: 2 cm right lateral subcutaneous mass reducible consistent with small incisional hernia at the right lateral buttock port site.   Abdomen soft and flat. Periumbilical incision. No hernia there. Nontender. Nondistended. No guarding. No peritonitis.   Female Genitourinary Sexual Maturity Tanner 5 - Adult hair pattern. Note: No vaginal bleeding nor discharge. No lymphadenopathy. No inguinal hernias.   Peripheral Vascular Upper Extremity Inspection - Left - No Cyanotic nailbeds, Not Ischemic. Right - No Cyanotic nailbeds, Not Ischemic.  Neurologic Neurologic evaluation reveals -normal attention span and ability to concentrate, able to name objects and repeat phrases. Appropriate fund of knowledge , normal sensation and normal coordination. Mental Status Affect - not angry, not paranoid. Cranial Nerves-Normal Bilaterally. Gait-Normal.  Neuropsychiatric Mental status exam performed with findings of-able to articulate well with normal speech/language, rate, volume and coherence, thought content normal with ability to perform basic computations and apply abstract reasoning and no evidence of hallucinations, delusions,  obsessions or homicidal/suicidal ideation.  Musculoskeletal Global Assessment Spine, Ribs and Pelvis - no instability, subluxation or laxity. Right Upper Extremity - no instability, subluxation or laxity.  Lymphatic Head & Neck General Head & Neck Lymphatics: Bilateral - Description - No Localized lymphadenopathy. Axillary General Axillary Region: Bilateral - Description - No Localized lymphadenopathy. Femoral & Inguinal Generalized Femoral & Inguinal Lymphatics: Left: Right - Description - No Localized lymphadenopathy. Description - No Localized lymphadenopathy.    Assessment & Plan [name](Rishita Petron C. Indea Dearman MD; 06/01/2016 4:21 PM) POSTPRANDIAL BLOATING (R14.0) Impression: Bloating and constipation suspicious for delayed gastric emptying. Retained food suspicious for gastric emptying delays. However gastric emptying study not particularly abnormal.  Given the persistent symptoms, I think she would benefit with a trial of metoclopramide QAC/QHS See if that helps with some of her bloating and other issues. Hopefully if we can get her constipation control, that would help as well Current Plans Pt Education - [facility] Free Text Education/Instructions: discussed with patient and provided information. Started Reglan 5MG , 1 (one) Tablet before every meal, #40, 06/01/2016, Ref. x2. DYSPHASIA (R47.02) Impression: Some dysphagia most likely related to the wrapping somewhat tight. Can tolerate liquids fine. Scope went across it.  Gas-X/Mylanta/Beano as needed for gassiness. Avoid carbonation. Again focus on smaller more frequent meals.  If her stomach issues and abdominal complaints are under better control, and she still struggles with symptoms; then, offer EGD with balloon or savory wire dilation a few times with Kenalog injections. See if that can help soften and open up the wrap a little bit.  The anatomy and the physiology was discussed. The pathophysiology and natural history of the disease was  discussed. Options were discussed and recommendations were made.  Technique, risks, benefits, & alternatives were discussed. Questions answered.  The patient agrees to proceed.   She may benefit from repeat manometry to rule out dysmotility. Not have weak peristalsis on preoperative evaluation. There was concerned that it was may be somewhat hyperactive. Did not seem classic for nutcracker or diffuse esophageal spasm, but it may be helpful to figure out if she has reproduction of pain with dysmotility.    If constipation controlled, stomach emptying better, and she still struggles with poor dysphagia; then, could offer takedown and redo of fundoplication to more of a Toupet.  Repeat Manometry 1st. Consiider pyloroplasty if still having some delayed stomach emptying issues. Current Plans Pt Education - [facility] Esophageal Surgery Diet HCI (Aryella Besecker): discussed with patient and provided information. CHRONIC CONSTIPATION (K59.09) Impression: I definitely recommend a fiber supplement.  She is on Benefiber. Given her episodes of bloating and crampiness, I would pick a different fiber supplement. Try flaxseed or MiraLAX. See if that works better for her.  Again noted that no one with constipation does not struggle with occasional bloating or discomfort. Correcting the constipation should definitely help with her abdominal complaints Current Plans Pt Education - [facility] Constipation (AT) Pt Education - [facility] Good Bowel Health (Emon Lance) INCISIONAL HERNIA, WITHOUT OBSTRUCTION OR GANGRENE (K43.2) Impression: Probable small incisional hernia at one of the right lateral robotic 8mm port site. 2 cm mass easily reducible. Does not seem to be likely a source of major pain or discomfort.  Reasonable to have it repaired laparoscopically with underlay mesh. Likely to hold off on any surgery intervention until rest of the workup is complete. She's more curious then really symptomatic from it.  Does not do well from a pain and  recovery standpoint with any operation, especially with a hernia operation. She did not seem severely symptomatic for her. Just is more curious. She wants to hold off on any intervention at this time Current Plans Follow up if no improvement or if symptoms worsen  Ardeth SportsmanSteven C. Reshunda Strider, M.D., F.A.C.S. Gastrointestinal and Minimally Invasive Surgery [facility] [address] [place], [place] [address] [phone] Main / Paging

## 2016-07-09 NOTE — Op Note (Signed)
07/09/2016  1:38 PM  PATIENT:  Cheryl [name]  56 y.o. female  Patient Care Team: Cheryl Corn, MD as PCP - General (Internal Medicine) Cheryl Soda, MD as Consulting Physician (General Surgery) Cheryl Cookey, MD as Consulting Physician (Gastroenterology)  PRE-OPERATIVE DIAGNOSIS:  history of fundoplication with dysphagia  POST-OPERATIVE DIAGNOSIS:    Dysphagia Retained food suspicious for delayed gastric emptying  PROCEDURE:  Procedure(s): ESOPHAGOGASTRODUODENOSCOPY (EGD) WITH PROPOFOL DILATION of fundoplication STEROID INJECTION of fundoplication  SURGEON:  Cheryl Sportsman, MD  ASSISTANT: RN   ANESTHESIA:   IV sedation  EBL:  Total I/O In: 800 [I.V.:800] Out: - .  See anesthesia record  Delay start of Pharmacological VTE agent (>24hrs) due to surgical blood loss or risk of bleeding:  no  DRAINS: none   SPECIMEN:  No Specimen  DISPOSITION OF SPECIMEN:  N/A  COUNTS:  YES  [name] OF CARE: Discharge to home after PACU  PATIENT DISPOSITION:  PACU - hemodynamically stable.  INDICATION: Patient with febrile GEN chronic pain and hiatal hernia.  Underwent minimally base reduction and repair with biologic mesh reinforcement.  Fundoplication given the fact she had normal manometry.  She has had persistent dysphasia.  Endoscopy showed some retained food but gastric emptying study normal.  No major changes with low-dose or Reglan.  I offered EGD with dilation and steroid injection of distal esophagus and fundoplication.  The anatomy & physiology of the foregut and anti-reflux mechanism was discussed.  The pathophysiology of hiatal herniation and GERD was discussed.  Natural history risks without surgery was discussed.   The patient's symptoms are not adequately controlled by medicines and other non-operative treatments.  I feel the risks of no intervention will lead to serious problems that outweigh the operative risks; therefore, I recommended surgery to reduce the hiatal hernia  out of the chest and fundoplication to rebuild the anti-reflux valve and control reflux better.  Need for a thorough workup to rule out the differential diagnosis and [name] treatment was explained.  I explained EGD with dilation.  Risks such as bleeding, infection, abscess, leak,injury to other organs, need for repair of tissues / organs, need for further treatment, stroke, heart attack, death, and other risks were discussed.   I noted a good likelihood this will help address the problem.  Goals of post-operative recovery were discussed as well.  Possibility that this will not correct all symptoms was explained.  Post-operative dysphagia, need for short-term liquid & pureed diet, inability to vomit, possibility of reherniation, possible need for medicines to help control symptoms in addition to surgery were discussed.  We will work to minimize complications.   Educational handouts further explaining the pathology, treatment options, and dysphagia diet was given as well.  Questions were answered.  The patient expresses understanding & wishes to proceed with EGD.  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  Transition zone 39-41 centimeters from the incisors.  Type I fundoplication valve with slight laxity but not as weak as a type II.  Balloon dilation done 12-13.5-15 cm.  Kenalog steroid injection done 4 quadrants at transition zone.  Moderate volume of retained food in stomach.  Pylorus out any stricturing.  Clean bile in duodenum without duodenitis.  DESCRIPTION: Informed consent was confirmed.  Patient underwent deep sedation monitored by anesthesia.  She was placed left side down decubitus position.  Or black placed.  Surgical timeout confirmed our [name].  Was able pass the endoscope transorally into the esophagus without difficulty.  Esophagus  was clean without any significant dilation.  Able to easily pass the scope down into a fundoplication starting at 41 cm.   Mildly snug but not strictured or tight. 2cm length  I encountered a moderate clump white solid particulate matter in the stomach consistent with retained food.  She had been on liquids only since midnight.  No major gastritis or irritation.  Retroflexion view revealed a type one valve at the EG junction with maybe a little laxity but otherwise pretty good circumferential wrap.  No ulceration or stricturing.  No evidence of recurrent hiatal hernia.  No laxity of the wrap with sniff or breath.  Endoscopy down to stomach revealed a normal-appearing pylorus with clear bile in a normal duodenal bulb.  No evidence of any stricturing or narrowing.  Stomach size not particularly enlarged.  I proceeded with balloon dilatation.  Passed a balloon dilator across esophagogastric junction.  Insufflated to 12 mm for 30 seconds, 13.5 mm 30 seconds, 50 mm 2 minutes.  She tolerated it fine without any difficulty.  Balloon desufflated.  No bleeding or ulceration.  I injected with Kenalog solution.  Kenalog-40.  1 mm injections 4 quadrants total.  Again hemostasis good.  Reinspection revealed no abnormalities or concerns.  Insufflation gas aspirated to decompress stomach and esophagus.  Patient will well without any difficulty.  Explained findings to the patient's husband and family.  Instructions discussed with the postoperative nurses as well.  Cheryl [name].  Cheryl [name], M.D., F.A.C.S. Gastrointestinal and Minimally Invasive Surgery [facility] [address] [place], [place] [id number] [phone] Main / Paging

## 2016-07-09 NOTE — Progress Notes (Signed)
Dr Krista BlueSinger informed of pt tearful/massive headache (as per preprocedure).  Orders received.

## 2016-07-09 NOTE — Anesthesia Postprocedure Evaluation (Signed)
Anesthesia Post Note  Patient: Cheryl [name]  Procedure(s) Performed: Procedure(s) (LRB): ESOPHAGOGASTRODUODENOSCOPY (EGD) WITH PROPOFOL (N/A)     Patient location during evaluation: Endoscopy Anesthesia Type: MAC Level of consciousness: awake and alert Pain management: pain level controlled Vital Signs Assessment: post-procedure vital signs reviewed and stable Respiratory status: spontaneous breathing and respiratory function stable Cardiovascular status: stable Anesthetic complications: no    Last Vitals:  Vitals:   07/09/16 1345 07/09/16 1400  BP: 116/68 (!) 145/69  Pulse: 63 65  Resp: 16 18  Temp:      Last Pain:  Vitals:   07/09/16 1400  TempSrc:   PainSc: 0-No pain                 [name] DANIEL

## 2016-07-09 NOTE — Discharge Instructions (Signed)
EATING AFTER YOUR ESOPHAGEAL SURGERY (Stomach Fundoplication, Hiatal Hernia repair, Achalasia surgery, etc)  ######################################################################  EAT Start with a clear & then  pureed / full liquid diet (see below) Gradually transition to a high fiber diet with a fiber supplement over the next month after discharge.    WALK Walk an hour a day.  Control your pain to do that.    CONTROL PAIN Control pain so that you can walk, sleep, tolerate sneezing/coughing, go up/down stairs.  HAVE A BOWEL MOVEMENT DAILY Keep your bowels regular to avoid problems.  OK to try a laxative to override constipation.  OK to use an antidairrheal to slow down diarrhea.  Call if not better after 2 tries  CALL IF YOU HAVE PROBLEMS/CONCERNS Call if you are still struggling despite following these instructions. Call if you have concerns not answered by these instructions  ######################################################################   After your esophageal surgery, expect some sticking with swallowing over the next 1-2 months.    If food sticks when you eat, it is called "dysphagia".  This is due to swelling around your esophagus at the wrap & hiatal diaphragm repair.  It will gradually ease off over the next few months.  To help you through this temporary phase, we start you out on a pureed (blenderized) diet.  Your first meal in the hospital was thin liquids.  You should have been given a pureed diet by next 24 hours to avoid anything getting "stuck" near your recent EGD diliation.  Don't be alarmed if your ability to swallow doesn't progress according to this plan.  Everyone is different and some diets can advance more or less quickly.     Some BASIC RULES to follow are:  Maintain an upright position whenever eating or drinking.  Take small bites - just a teaspoon size bite at a time.  Eat slowly.  It may also help to eat only one food at a time.  Consider  nibbling through smaller, more frequent meals & avoid the urge to eat BIG meals  Do not push through feelings of fullness, nausea, or bloatedness  Do not mix solid foods and liquids in the same mouthful  Try not to "wash foods down" with large gulps of liquids.  Avoid carbonated (bubbly/fizzy) drinks.    Avoid foods that make you feel gassy or bloated.  Start with bland foods first.  Wait on trying greasy, fried, or spicy meals until you are tolerating more bland solids well.  Understand that it will be hard to burp and belch at first.  This gradually improves with time.  Expect to be more gassy/flatulent/bloated initially.  Walking will help your body manage it better.  Consider using medications for bloating that contain simethicone such as  Maalox or Gas-X   Eat in a relaxed atmosphere & minimize distractions.  Avoid talking while eating.    Do not use straws.  Following each meal, sit in an upright position (90 degree angle) for 60 to 90 minutes.  Going for a short walk can help as well  If food does stick, don't panic.  Try to relax and let the food pass on its own.  Sipping WARM LIQUID such as strong hot black tea can also help slide it down.   Be gradual in changes & use common sense:  -If you easily tolerating a certain "level" of foods, advance to the next level gradually -If you are having trouble swallowing a particular food, then avoid it.   -If food  is sticking when you advance your diet, go back to thinner previous diet (the lower LEVEL) for 1-2 days.  LEVEL 1 = PUREED DIET  Do for the first WEEKEND AFTER EGD  -Foods in this group are pureed or blenderized to a smooth, mashed potato-like consistency.  -If necessary, the pureed foods can keep their shape with the addition of a thickening agent.   -Meat should be pureed to a smooth, pasty consistency.  Hot broth or gravy may be added to the pureed meat, approximately 1 oz. of liquid per 3 oz. serving of  meat. -CAUTION:  If any foods do not puree into a smooth consistency, swallowing will be more difficult.  (For example, nuts or seeds sometimes do not blend well.)  Hot Foods Cold Foods  Pureed scrambled eggs and cheese Pureed cottage cheese  Baby cereals Thickened juices and nectars  Thinned cooked cereals (no lumps) Thickened milk or eggnog  Pureed [place] toast or pancakes Ensure  Mashed potatoes Ice cream  Pureed parsley, au gratin, scalloped potatoes, candied sweet potatoes Fruit or [place] ice, sherbet  Pureed buttered or alfredo noodles Plain yogurt  Pureed vegetables (no corn or peas) Instant breakfast  Pureed soups and creamed soups Smooth pudding, mousse, custard  Pureed scalloped apples Whipped gelatin  Gravies Sugar, syrup, honey, jelly  Sauces, cheese, tomato, barbecue, white, creamed Cream  Any baby food Creamer  Alcohol in moderation (not beer or champagne) Margarine  Coffee or tea Mayonnaise   Ketchup, mustard   Apple sauce   SAMPLE MENU:  PUREED DIET Breakfast Lunch Dinner   Orange juice, 1/2 cup  Cream of wheat, 1/2 cup  Pineapple juice, 1/2 cup  Pureed [place], barley soup, 3/4 cup  Pureed Hawaiian chicken, 3 oz   Scrambled eggs, mashed or blended with cheese, 1/2 cup  Tea or coffee, 1 cup   Whole milk, 1 cup   Non-dairy creamer, 2 Tbsp.  Mashed potatoes, 1/2 cup  Pureed cooled broccoli, 1/2 cup  Apple sauce, 1/2 cup  Coffee or tea  Mashed potatoes, 1/2 cup  Pureed spinach, 1/2 cup  Frozen yogurt, 1/2 cup  Tea or coffee      LEVEL 2 = SOFT DIET  After your first 2 weeks, you can advance to a soft diet.   Keep on this diet until everything goes down easily.  Hot Foods Cold Foods  White fish Cottage cheese  Stuffed fish Junior baby fruit  Baby food meals Semi thickened juices  Minced soft cooked, scrambled, poached eggs nectars  Souffle & omelets Ripe mashed bananas  Cooked cereals Canned fruit, pineapple sauce, milk  potatoes  Milkshake  Buttered or Alfredo noodles Custard  Cooked cooled vegetable Puddings, including tapioca  Sherbet Yogurt  Vegetable soup or alphabet soup Fruit ice, [place] ice  Gravies Whipped gelatin  Sugar, syrup, honey, jelly Junior baby desserts  Sauces:  Cheese, creamed, barbecue, tomato, white Cream  Coffee or tea Margarine   SAMPLE MENU:  LEVEL 2 Breakfast Lunch Dinner   Orange juice, 1/2 cup  Oatmeal, 1/2 cup  Scrambled eggs with cheese, 1/2 cup  Decaffeinated tea, 1 cup  Whole milk, 1 cup  Non-dairy creamer, 2 Tbsp  Pineapple juice, 1/2 cup  Minced beef, 3 oz  Gravy, 2 Tbsp  Mashed potatoes, 1/2 cup  Minced fresh broccoli, 1/2 cup  Applesauce, 1/2 cup  Coffee, 1 cup  [place], barley soup, 3/4 cup  Minced Hawaiian chicken, 3 oz  Mashed potatoes, 1/2 cup  Cooked spinach, 1/2  cup  Frozen yogurt, 1/2 cup  Non-dairy creamer, 2 Tbsp      LEVEL 3 = CHOPPED DIET  -After all the foods in level 2 (soft diet) are passing through well you should advance up to more chopped foods.  -It is still important to cut these foods into small pieces and eat slowly.  Hot Foods Cold Foods  Poultry Cottage cheese  Chopped Swedish meatballs Yogurt  Meat salads (ground or flaked meat) Milk  Flaked fish (tuna) Milkshakes  Poached or scrambled eggs Soft, cold, dry cereal  Souffles and omelets Fruit juices or nectars  Cooked cereals Chopped canned fruit  Chopped [place] toast or pancakes Canned fruit cocktail  Noodles or pasta (no rice) Pudding, mousse, custard  Cooked vegetables (no frozen peas, corn, or mixed vegetables) Green salad  Canned small sweet peas Ice cream  Creamed soup or vegetable soup Fruit ice, [place] ice  Pureed vegetable soup or alphabet soup Non-dairy creamer  Ground scalloped apples Margarine  Gravies Mayonnaise  Sauces:  Cheese, creamed, barbecue, tomato, white Ketchup  Coffee or tea Mustard   SAMPLE MENU:  LEVEL 3 Breakfast Lunch Dinner    Orange juice, 1/2 cup  Oatmeal, 1/2 cup  Scrambled eggs with cheese, 1/2 cup  Decaffeinated tea, 1 cup  Whole milk, 1 cup  Non-dairy creamer, 2 Tbsp  Ketchup, 1 Tbsp  Margarine, 1 tsp  Salt, 1/4 tsp  Sugar, 2 tsp  Pineapple juice, 1/2 cup  Ground beef, 3 oz  Gravy, 2 Tbsp  Mashed potatoes, 1/2 cup  Cooked spinach, 1/2 cup  Applesauce, 1/2 cup  Decaffeinated coffee  Whole milk  Non-dairy creamer, 2 Tbsp  Margarine, 1 tsp  Salt, 1/4 tsp  Pureed [place], barley soup, 3/4 cup  Barbecue chicken, 3 oz  Mashed potatoes, 1/2 cup  Ground fresh broccoli, 1/2 cup  Frozen yogurt, 1/2 cup  Decaffeinated tea, 1 cup  Non-dairy creamer, 2 Tbsp  Margarine, 1 tsp  Salt, 1/4 tsp  Sugar, 1 tsp    LEVEL 4:  REGULAR FOODS  -Foods in this group are soft, moist, regularly textured foods.   -This level includes meat and breads, which tend to be the hardest things to swallow.   -Eat very slowly, chew well and continue to avoid carbonated drinks. -most people are at this level in 4-6 weeks  Hot Foods Cold Foods  Baked fish or skinned Soft cheeses - cottage cheese  Souffles and omelets Cream cheese  Eggs Yogurt  Stuffed shells Milk  Spaghetti with meat sauce Milkshakes  Cooked cereal Cold dry cereals (no nuts, dried fruit, coconut)  [place] toast or pancakes Crackers  Buttered toast Fruit juices or nectars  Noodles or pasta (no rice) Canned fruit  Potatoes (all types) Ripe bananas  Soft, cooked vegetables (no corn, lima, or baked beans) Peeled, ripe, fresh fruit  Creamed soups or vegetable soup Cakes (no nuts, dried fruit, coconut)  Canned chicken noodle soup Plain doughnuts  Gravies Ice cream  Bacon dressing Pudding, mousse, custard  Sauces:  Cheese, creamed, barbecue, tomato, white Fruit ice, [place] ice, sherbet  Decaffeinated tea or coffee Whipped gelatin  Pork chops Regular gelatin   Canned fruited gelatin molds   Sugar, syrup, honey, jam, jelly    Cream   Non-dairy   Margarine   Oil   Mayonnaise   Ketchup   Mustard   TROUBLESHOOTING IRREGULAR BOWELS  1) Avoid extremes of bowel movements (no bad constipation/diarrhea)  2) Miralax 17gm mixed in 8oz. water or juice-daily.  May use BID as needed.  3) Gas-x,Phazyme, etc. as needed for gas & bloating.  4) Soft,bland diet. No spicy,greasy,fried foods.  5) Prilosec over-the-counter as needed  6) May hold gluten/wheat products from diet to see if symptoms improve.  7) May try probiotics (Align, Activa, etc) to help calm the bowels down  7) If symptoms become worse call back immediately.    If you have any questions please call our office at [facility]: [phone].

## 2016-07-09 NOTE — Transfer of Care (Signed)
Immediate Anesthesia Transfer of Care Note  Patient: Cheryl [name]  Procedure(s) Performed: Procedure(s) with comments: ESOPHAGOGASTRODUODENOSCOPY (EGD) WITH PROPOFOL (N/A) - balloon dilation  Patient Location: PACU  Anesthesia Type:MAC  Level of Consciousness:  sedated, patient cooperative and responds to stimulation  Airway & Oxygen Therapy:Patient Spontanous Breathing and Patient connected to face mask oxgen  Post-op Assessment:  Report given to PACU RN and Post -op Vital signs reviewed and stable  Post vital signs:  Reviewed and stable  Last Vitals:  Vitals:   07/09/16 1121  BP: (!) 116/59  Resp: 11  Temp: 34.7 C    Complications: No apparent anesthesia complications

## 2016-07-09 NOTE — Anesthesia Preprocedure Evaluation (Signed)
Anesthesia Evaluation  Patient identified by MRN, date of birth, ID band Patient awake    Reviewed: Allergy & Precautions, H&P , NPO status , Patient's Chart, lab work & pertinent test results  Airway Mallampati: II  TM Distance: >3 FB Neck ROM: Full    Dental  (+) Teeth Intact, Dental Advisory Given   Pulmonary neg pulmonary ROS,    Pulmonary exam normal breath sounds clear to auscultation       Cardiovascular Exercise Tolerance: Good Normal cardiovascular exam Rhythm:Regular Rate:Normal     Neuro/Psych  Headaches, PSYCHIATRIC DISORDERS Anxiety Depression    GI/Hepatic Neg liver ROS, hiatal hernia, GERD  Controlled,  Endo/Other  negative endocrine ROS  Renal/GU negative Renal ROS     Musculoskeletal negative musculoskeletal ROS (+)   Abdominal (+) - obese,   Peds  Hematology negative hematology ROS (+)   Anesthesia Other Findings   Reproductive/Obstetrics                             Anesthesia Physical  Anesthesia Plan  ASA: II  Anesthesia Plan: MAC   Post-op Pain Management:    Induction:   PONV Risk Score and Plan: Ondansetron and Propofol  Airway Management Planned: Natural Airway and Simple Face Mask  Additional Equipment:   Intra-op Plan:   Post-operative Plan:   Informed Consent: I have reviewed the patients History and Physical, chart, labs and discussed the procedure including the risks, benefits and alternatives for the proposed anesthesia with the patient or authorized representative who has indicated his/her understanding and acceptance.   Dental advisory given  Plan Discussed with: CRNA and Surgeon  Anesthesia Plan Comments:         Anesthesia Quick Evaluation

## 2016-07-10 ENCOUNTER — Encounter (HOSPITAL_COMMUNITY): Payer: Self-pay | Admitting: Surgery

## 2018-10-16 IMAGING — RF DG UGI W/ HIGH DENSITY W/KUB
10 of 16 series · 14 of 24 positions shown · non-contrast
Comparison: 01/02/2016

CLINICAL DATA: Dysphagia to food and liquids after Nissen
fundoplication.

EXAM:
UPPER GI SERIES WITH KUB
TECHNIQUE: After obtaining a scout radiograph a routine upper GI series was
performed using thin and high density barium.
FLUOROSCOPY TIME:  Fluoroscopy Time:  2 minutes 18 seconds
Radiation Exposure Index (if provided by the fluoroscopic device):
17.6 mGy
Number of Acquired Spot Images: 7

[Series 1: t abdomen supine · 0.15mm/px · 1 of 1 slices shown]
[im 1/1]
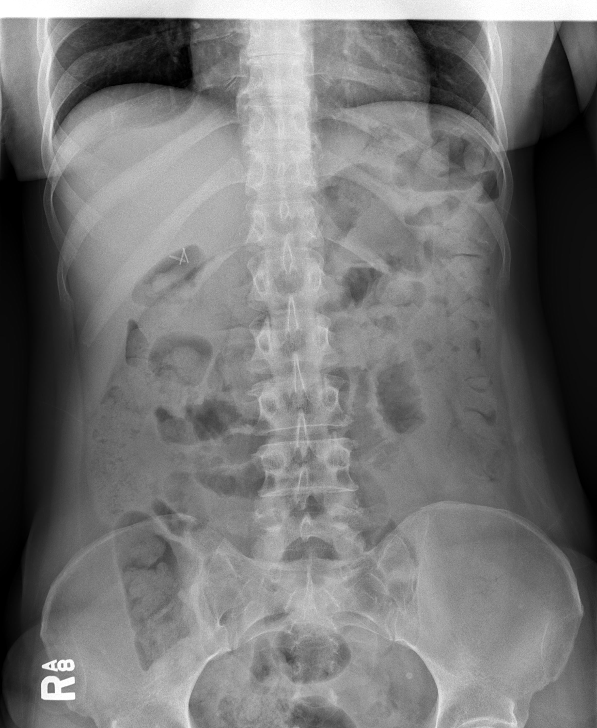

[Series 3: cp_standard · 0.51mm/px · 2 of 22 frames shown (1 of 7)]
[frame 4/22]
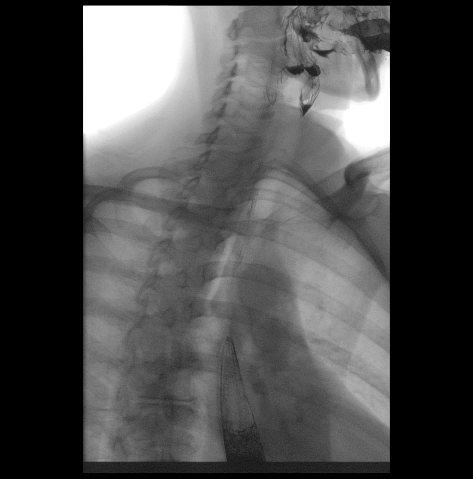
[frame 19/22]
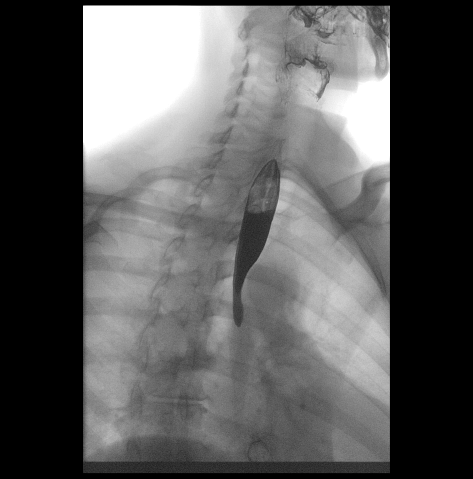

[Series 5: cp_standard · 0.51mm/px · 2 of 42 frames shown (2 of 7)]
[frame 22/42]
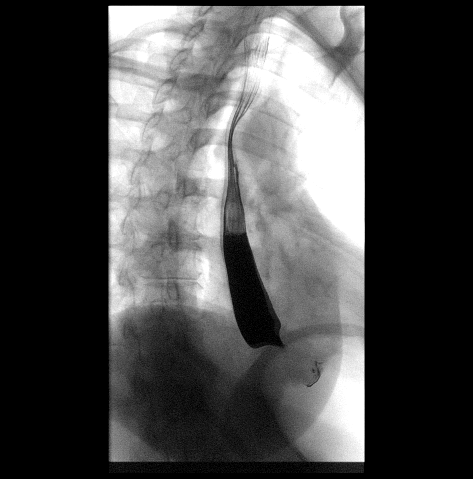
[frame 34/42]
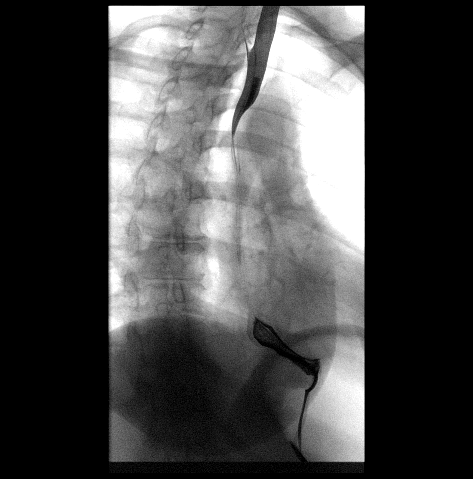

[Series 6: cp_standard · 0.51mm/px · 2 of 15 frames shown (3 of 7)]
[frame 3/15]
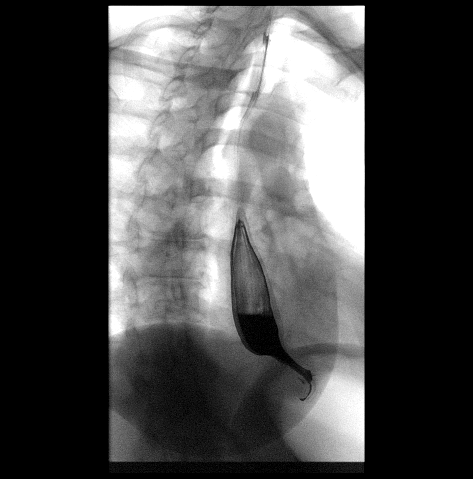
[frame 13/15]
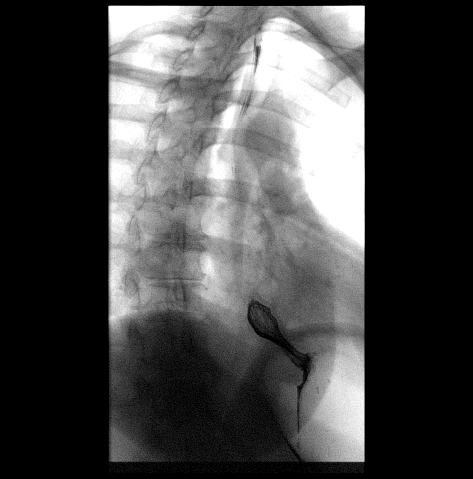

[Series 7: fluoro_barium singleshot_bw · 0.18mm/px · 1 of 1 slices shown (1 of 2)]
[im 1/1]
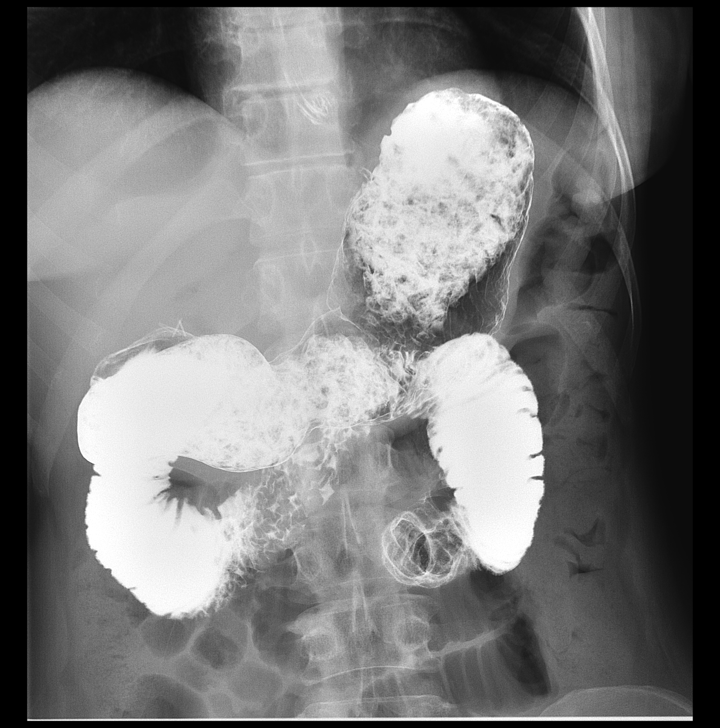

[Series 10: fluoro_barium singleshot_bw · 0.18mm/px · 1 of 1 slices shown (2 of 2)]
[im 1/1]
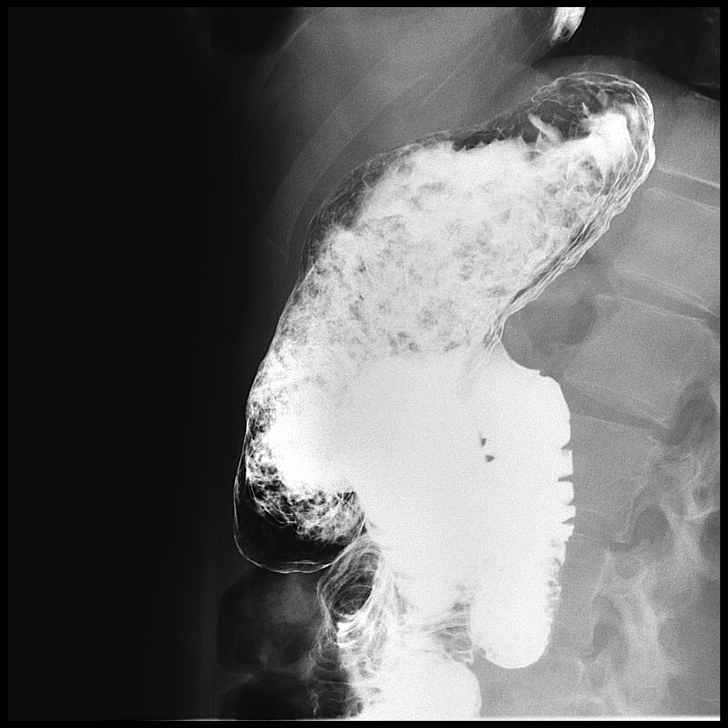

[Series 12: cp_standard · 0.36mm/px · 2 of 17 frames shown (4 of 7)]
[frame 3/17]
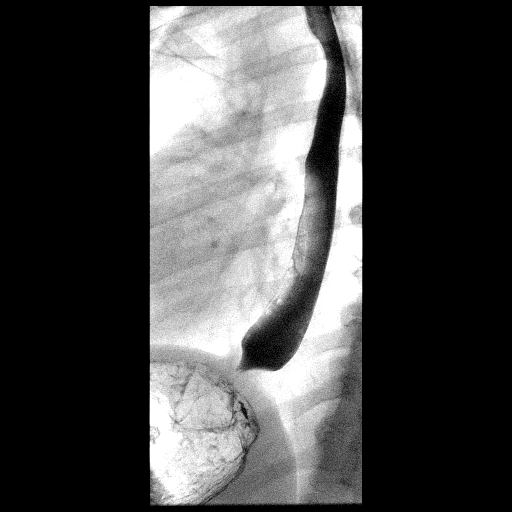
[frame 15/17]
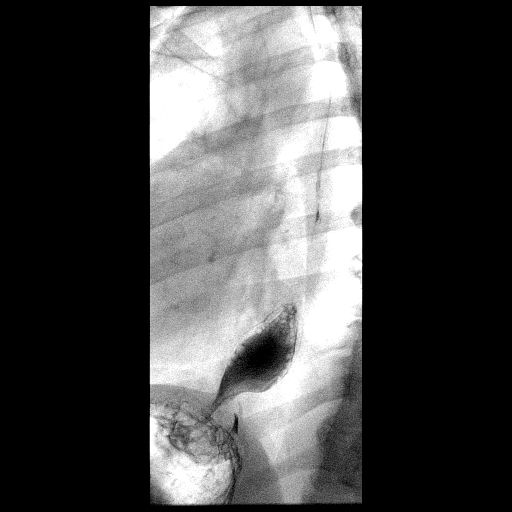

[Series 13: cp_standard · 0.18mm/px · 1 of 1 slices shown (5 of 7)]
[im 1/1]
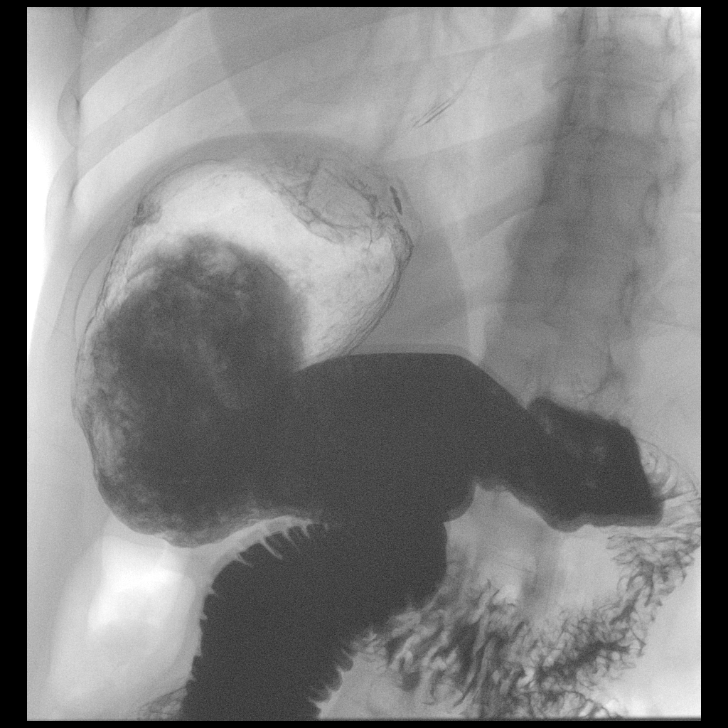

[Series 15: cp_standard · 0.18mm/px · 1 of 1 slices shown (6 of 7)]
[im 1/1]
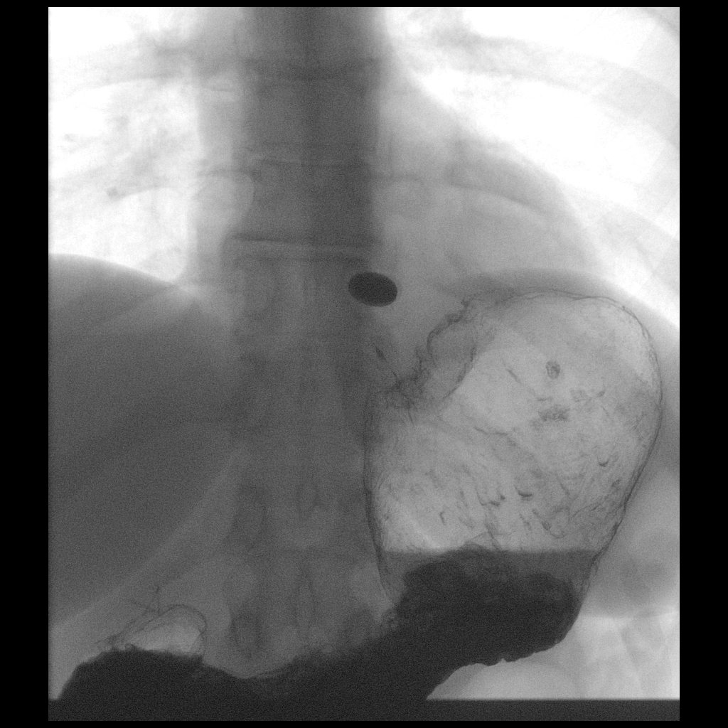

[Series 18: cp_standard · 0.18mm/px · 1 of 1 slices shown (7 of 7)]
[im 1/1]
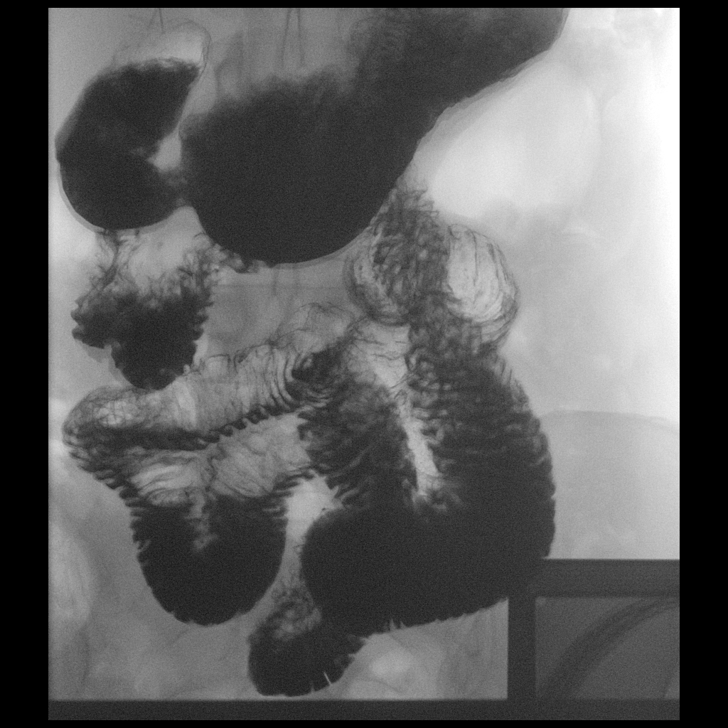

[14 of 24 positions shown; findings below may reference images not displayed]

FINDINGS: Scout image shows a moderate volume of formed stool. Bowel gas
pattern is nonobstructive. Cholecystectomy clips. Clear lung bases.
No concerning mass effect or calcification.

Patient had recent modified barium swallow and pharyngeal imaging
was not repeated.

The esophagus has normal distensibility and course. No evidence of
mucosal lesion. Motility was normal today.

Defect at the GE junction consistent with history of Nissen
fundoplication. Maximal channel opening was 6 mm. A 13 mm barium
tablet would not pass the GE junction. No stasis with liquids.

Prominent volume of digested food within the stomach, despite prompt
emptying into the small bowel. There was enough food in the stomach
that much of the gastric mucosa was obscured. Distortion at the
gastric cardia correlating with history of fundoplication. No
mucosal lesion is seen. Prominent small bowel diameter measuring up
to 35 mm, but no suspected obstruction. No duodenal or proximal
jejunal fold abnormality.
IMPRESSION: 1. Nissen fundoplication. A 13 mm barium tablet would not progress
through the GE junction due to channel diameter. No stasis with
liquids.
2. Prominent volume of partially digested food in the stomach,
despite report of NPO after midnight, limiting gastric study. No
causative gastric outlet obstruction or delayed gastric emptying.
The material is not tightly cohesive to suggest bezoar.
3. Normal esophagus, including motility.

## 2019-01-08 ENCOUNTER — Emergency Department (HOSPITAL_COMMUNITY): Payer: Self-pay

## 2019-01-08 ENCOUNTER — Other Ambulatory Visit: Payer: Self-pay

## 2019-01-08 ENCOUNTER — Emergency Department (HOSPITAL_COMMUNITY)
Admission: EM | Admit: 2019-01-08 | Discharge: 2019-01-08 | Disposition: A | Discharge status: Home / Self Care | Payer: Self-pay | Attending: Emergency Medicine | Admitting: Emergency Medicine

## 2019-01-08 DIAGNOSIS — R339 Retention of urine, unspecified: Secondary | ICD-10-CM | POA: Insufficient documentation

## 2019-01-08 DIAGNOSIS — N200 Calculus of kidney: Secondary | ICD-10-CM | POA: Insufficient documentation

## 2019-01-08 DIAGNOSIS — R110 Nausea: Secondary | ICD-10-CM | POA: Insufficient documentation

## 2019-01-08 DIAGNOSIS — Z79899 Other long term (current) drug therapy: Secondary | ICD-10-CM | POA: Insufficient documentation

## 2019-01-08 LAB — BASIC METABOLIC PANEL
Anion gap: 8 (ref 5–15)
BUN: 22 mg/dL — ABNORMAL HIGH (ref 6–20)
CO2: 24 mmol/L (ref 22–32)
Calcium: 8.7 mg/dL — ABNORMAL LOW (ref 8.9–10.3)
Chloride: 109 mmol/L (ref 98–111)
Creatinine, Ser: 0.79 mg/dL (ref 0.44–1.00)
GFR calc Af Amer: >60 mL/min (ref >60)
GFR calc non Af Amer: >60 mL/min (ref >60)
Glucose, Bld: 155 mg/dL — ABNORMAL HIGH (ref 70–99)
Potassium: 4.2 mmol/L (ref 3.5–5.1)
Sodium: 141 mmol/L (ref 135–145)

## 2019-01-08 LAB — CBC WITH DIFFERENTIAL/PLATELET
Abs Immature Granulocytes: 0.01 10*3/uL (ref 0.00–0.07)
Basophils Absolute: 0 10*3/uL (ref 0.0–0.1)
Basophils Relative: 1 %
Eosinophils Absolute: 0.1 10*3/uL (ref 0.0–0.5)
Eosinophils Relative: 1 %
HCT: 37.7 % (ref 36.0–46.0)
Hemoglobin: 12.1 g/dL (ref 12.0–15.0)
Immature Granulocytes: 0 %
Lymphocytes Relative: 37 %
Lymphs Abs: 2.7 10*3/uL (ref 0.7–4.0)
MCH: 31.2 pg (ref 26.0–34.0)
MCHC: 32.1 g/dL (ref 30.0–36.0)
MCV: 97.2 fL (ref 80.0–100.0)
Monocytes Absolute: 0.4 10*3/uL (ref 0.1–1.0)
Monocytes Relative: 6 %
Neutro Abs: 4 10*3/uL (ref 1.7–7.7)
Neutrophils Relative %: 55 %
Platelets: 235 10*3/uL (ref 150–400)
RBC: 3.88 MIL/uL (ref 3.87–5.11)
RDW: 13 % (ref 11.5–15.5)
WBC: 7.2 10*3/uL (ref 4.0–10.5)
nRBC: 0 % (ref 0.0–0.2)

## 2019-01-08 LAB — URINALYSIS, ROUTINE W REFLEX MICROSCOPIC
Bilirubin Urine: NEGATIVE
Glucose, UA: NEGATIVE mg/dL
Ketones, ur: NEGATIVE mg/dL
Leukocytes,Ua: NEGATIVE
Nitrite: NEGATIVE
Protein, ur: NEGATIVE mg/dL
Specific Gravity, Urine: 1.012 (ref 1.005–1.030)
pH: 6 (ref 5.0–8.0)

## 2019-01-08 MED ORDER — OXYCODONE-ACETAMINOPHEN 5-325 MG PO TABS: 1.0000 | ORAL_TABLET | ORAL | 0 refills | Status: AC | PRN

## 2019-01-08 MED ORDER — MORPHINE SULFATE (PF) 4 MG/ML IV SOLN
4.0000 mg | Freq: Once | INTRAVENOUS | Status: AC
  Administered 2019-01-08: 05:00:00 4 mg via INTRAVENOUS
  Filled 2019-01-08: qty 1

## 2019-01-08 MED ORDER — ONDANSETRON 4 MG PO TBDP: 4.0000 mg | ORAL_TABLET | Freq: Three times a day (TID) | ORAL | 0 refills | Status: AC | PRN

## 2019-01-08 MED ORDER — SODIUM CHLORIDE 0.9 % IV BOLUS
1000.0000 mL | Freq: Once | INTRAVENOUS | Status: AC
  Administered 2019-01-08: 1000 mL via INTRAVENOUS

## 2019-01-08 MED ORDER — KETOROLAC TROMETHAMINE 15 MG/ML IJ SOLN
15.0000 mg | Freq: Once | INTRAMUSCULAR | Status: AC
  Administered 2019-01-08: 15 mg via INTRAVENOUS
  Filled 2019-01-08: qty 1

## 2019-01-08 NOTE — ED Notes (Signed)
Pt made aware of need for urine specimen. Pt stated she will provide one when she can.

## 2019-01-08 NOTE — ED Triage Notes (Signed)
Per EMS, pt is coming from home with left sided flank pain that radiates into her abdomen and groin. Per EMS, pt reports difficulty urinating and decreased urine output. Per EMS, pt reports having this issue 2 weeks ago and it resolved on its own and was not this severe. Pt complains of nausea and diarrhea. EMS administered 100 mcg fentanyl and 4 mg zofran in route. Pt is AO x4 and ambulates independently.

## 2019-01-08 NOTE — ED Provider Notes (Signed)
[facility]-EMERGENCY DEPT Provider Note   CSN: [id number] Arrival date & time: 01/08/19  [id number]     History Chief Complaint  Patient presents with  . Left Flank Pain    Cheryl Fields is a 58 y.o. female.  HPI     This is a 58 year old female with a history of hiatal hernia, hyper lipidemia, anxiety who presents with left flank pain.  Patient reports onset of difficulty urinating on Friday.  She reports that she has a sensation to urinate but has difficulty voiding and emptying her bladder.  On Saturday she noted left flank pain that was radiating into her left groin.  It comes and goes.  It is not particularly worse with anything.  He states the pain has become more intense.  It is currently 10 out of 10.  She reports nausea without vomiting.  No history of the same.  No known history of kidney stones.  Denies fevers.  Past Medical History:  Diagnosis Date  . Anxiety   . Choledocholithiasis with chronic cholecystitis 08/13/2011  . Depression   . GERD (gastroesophageal reflux disease)   . History of hiatal hernia    PARAESOPHAGEAL  . HOH (hard of hearing) BOTH EARS,HAS HEARING AIDES  . Hyperlipidemia   . Migraines     Patient Active Problem List   Diagnosis Date Noted  . 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 Dysphagia 07/09/2016  . Paraesophageal hiatal hernia s/p robotic repair/mesh/Nissen 05/29/2015 05/29/2015  . Dyspnea and respiratory abnormality 03/12/2012  . Solitary pulmonary nodule 03/12/2012  . Abdominal pain, acute, LUQ & epigastric 08/13/2011  . Nausea & vomiting 08/13/2011  . Anxiety   . Migraines     Past Surgical History:  Procedure Laterality Date  . ABDOMINAL HYSTERECTOMY     partial, RIGHT OVARY REMOVED  . CHOLECYSTECTOMY  09/03/11  . CYSTECTOMY    . ERCP  09/04/2011   Procedure: ENDOSCOPIC RETROGRADE CHOLANGIOPANCREATOGRAPHY (ERCP);  Surgeon: Graylin ShiverSalem F Ganem, MD;  [facility];  Service: Endoscopy;  Laterality: N/A;   . ESOPHAGEAL MANOMETRY N/A 05/07/2014   Procedure: ESOPHAGEAL MANOMETRY (EM);  Surgeon: Dorena CookeyJohn Hayes, MD;  Location: [facility];  Service: Endoscopy;  Laterality: N/A;  . ESOPHAGOGASTRODUODENOSCOPY (EGD) WITH PROPOFOL N/A 07/09/2016   Procedure: ESOPHAGOGASTRODUODENOSCOPY (EGD) WITH PROPOFOL;  Surgeon: Karie SodaGross, Steven, MD;  Location: [facility];  Service: General;  Laterality: N/A;  balloon dilation  . INTRAOPERATIVE CHOLANGIOGRAM  09/03/2011   Procedure: INTRAOPERATIVE CHOLANGIOGRAM;  Surgeon: Ardeth SportsmanSteven C. Gross, MD;  Location: [facility];  Service: General;;  . LAPAROSCOPIC NISSEN FUNDOPLICATION  05/29/2015  . NASAL SEPTUM SURGERY    . TONSILLECTOMY    . TUBAL LIGATION       OB History   No obstetric history on file.     Family History  Problem Relation Age of Onset  . Cancer Mother        breast  . Cancer Paternal Grandmother        colon  . Colon cancer Paternal Grandmother   . Cancer Paternal Grandfather        olon  . Colon cancer Paternal Grandfather     Social History   Tobacco Use  . Smoking status: Never Smoker  . Smokeless tobacco: Never Used  Substance Use Topics  . Alcohol use: No  . Drug use: No    Home Medications Prior to Admission medications   Medication Sig Start Date End Date Taking? Authorizing Provider  acetaminophen (TYLENOL) 500 MG tablet Take 1,000  mg by mouth every 6 (six) hours as needed.   Yes [provider]  ALPRAZolam (XANAX) 0.5 MG tablet Take 0.25-0.5 mg by mouth 2 (two) times daily as needed for anxiety. For anxiety   Yes [provider]  buPROPion (WELLBUTRIN XL) 150 MG 24 hr tablet Take 450 mg by mouth every morning.    Yes [provider]  cholecalciferol (VITAMIN D3) 25 MCG (1000 UT) tablet Take 1,000 Units by mouth daily.   Yes [provider]  estradiol (ESTRACE) 0.5 MG tablet Take 0.5 mg by mouth daily.  04/26/15  Yes [provider]  loperamide (IMODIUM) 2 MG capsule Take 2 mg by mouth as  needed for diarrhea or loose stools.   Yes [provider]  vitamin B-12 (CYANOCOBALAMIN) 1000 MCG tablet Take 1,000 mcg by mouth daily.   Yes [provider]  metoCLOPramide (REGLAN) 10 MG tablet Take 0.5 tablets (5 mg total) by mouth 3 (three) times daily before meals. Patient not taking: Reported on 01/08/2019 07/09/16   Karie Soda, MD  promethazine (PHENERGAN) 12.5 MG tablet Take 0.5-1 tablets (6.25-12.5 mg total) by mouth every 6 (six) hours as needed for nausea. For nausea Patient not taking: Reported on 01/08/2019 05/29/15   Karie Soda, MD    Allergies    Voltaren [diclofenac sodium]  Review of Systems   Review of Systems  Constitutional: Negative for fever.  Respiratory: Negative for shortness of breath.   Cardiovascular: Negative for chest pain.  Gastrointestinal: Positive for nausea. Negative for abdominal pain and vomiting.  Genitourinary: Positive for difficulty urinating and flank pain. Negative for dysuria and hematuria.  All other systems reviewed and are negative.   Physical Exam Updated Vital Signs BP 135/72   Pulse 68   Temp (!) 97.4 F (36.3 C) (Oral)   Resp 16   SpO2 100%   Physical Exam Vitals and nursing note reviewed.  Constitutional:      Appearance: She is well-developed.     Comments: Uncomfortable appearing but nontoxic  HENT:     Head: Normocephalic and atraumatic.     Mouth/Throat:     Mouth: Mucous membranes are moist.  Eyes:     Pupils: Pupils are equal, round, and reactive to light.  Cardiovascular:     Rate and Rhythm: Normal rate and regular rhythm.  Pulmonary:     Effort: Pulmonary effort is normal. No respiratory distress.     Breath sounds: No wheezing.  Abdominal:     General: Bowel sounds are normal.     Palpations: Abdomen is soft.     Tenderness: There is no abdominal tenderness. There is no right CVA tenderness or left CVA tenderness.  Musculoskeletal:     Cervical back: Neck supple.     Right lower  leg: No edema.     Left lower leg: No edema.  Skin:    General: Skin is warm and dry.  Neurological:     Mental Status: She is alert and oriented to person, place, and time.  Psychiatric:        Mood and Affect: Mood normal.     ED Results / Procedures / Treatments   Labs (all labs ordered are listed, but only abnormal results are displayed) Labs Reviewed  BASIC METABOLIC PANEL - Abnormal; Notable for the following components:      Result Value   Glucose, Bld 155 (*)    BUN 22 (*)    Calcium 8.7 (*)    All other  components within normal limits  CBC WITH DIFFERENTIAL/PLATELET  URINALYSIS, ROUTINE W REFLEX MICROSCOPIC    EKG None  Radiology CT Renal Stone Study  Result Date: 01/08/2019 CLINICAL DATA:  Left flank pain. Nausea and diarrhea. EXAM: CT ABDOMEN AND PELVIS WITHOUT CONTRAST TECHNIQUE: Multidetector CT imaging of the abdomen and pelvis was performed following the standard protocol without IV contrast. COMPARISON:  None. FINDINGS: Lower chest: Minimal linear opacities in the right middle lobe and lingula, favor scarring. No acute airspace disease. No pleural fluid. Hepatobiliary: Pneumobilia, left greater than right, likely postprocedural. Post cholecystectomy. No common duct dilatation. No evidence of focal hepatic lesion. Pancreas: No ductal dilatation or inflammation. Spleen: Normal in size without focal abnormality. Adrenals/Urinary Tract: No adrenal nodule. Obstructing 4 mm stone at the left ureterovesicular junction with mild hydroureteronephrosis. Minimal left perinephric edema. No additional nonobstructing stone in the left kidney. No right hydronephrosis or renal stone. Right ureter is decompressed. Urinary bladder is minimally distended. No bladder wall thickening. Stomach/Bowel: Prior Nissen fundoplication. Ingested material within the stomach. Few fluid-filled prominent loops of small bowel with fecalization of distal small bowel contents. The appendix is not  confidently visualized. No evidence of appendicitis. Small volume of colonic stool. Vascular/Lymphatic: Aorto bi-iliac atherosclerosis. No aneurysm. No bulky abdominopelvic adenopathy. Reproductive: Status post hysterectomy. No adnexal masses. Other: No ascites. No free air. Right upper quadrant fat containing ventral abdominal wall hernia. Musculoskeletal: There are no acute or suspicious osseous abnormalities. Bone island within L4. IMPRESSION: 1. Obstructing 4 mm stone at the left ureterovesicular junction with mild hydroureteronephrosis. 2. Fluid-filled small bowel with slight fecalization of distal small bowel contents, suggesting slow bowel transit. Aortic Atherosclerosis (ICD10-I70.0). Electronically Signed   By: Keith Rake M.D.   On: 01/08/2019 05:11    Procedures Procedures (including critical care time)  Medications Ordered in ED Medications  sodium chloride 0.9 % bolus 1,000 mL (1,000 mLs Intravenous New Bag/Given 01/08/19 0430)  morphine 4 MG/ML injection 4 mg (4 mg Intravenous Given 01/08/19 0430)  ketorolac (TORADOL) 15 MG/ML injection 15 mg (15 mg Intravenous Given 01/08/19 1443)    ED Course  I have reviewed the triage vital signs and the nursing notes.  Pertinent labs & imaging results that were available during my care of the patient were reviewed by me and considered in my medical decision making (see chart for details).  Clinical Course as of Jan 07 705  Sun Jan 08, 2019  1540 Patient feeling much better.  Will attempt p.o. challenge.  Still waiting for urine.  I discussed with her treatment and follow-up plan.  Patient is agreeable.   [CH]    Clinical Course User Index [CH] Maveryck Bahri, Barbette Hair, MD   MDM Rules/Calculators/A&P                       Patient presents with colicky left flank pain.  Highly suspicious for kidney stone.  She is uncomfortable appearing but nontoxic.  Afebrile.  Lab work obtained.  CT stone study obtained and shows a 4 mm stone.   Patient much improved after Toradol and morphine.  Urinalysis is pending.  I discussed with patient supportive care and follow-up as needed with urology.  Patient signed out pending urinalysis.  Final Clinical Impression(s) / ED Diagnoses Final diagnoses:  Kidney stone    Rx / DC Orders ED Discharge Orders    None       Merryl Hacker, MD 01/08/19 [id number]

## 2019-01-08 NOTE — ED Provider Notes (Signed)
Pt was signed out pending a UA.  Urine shows blood, but no infection.  Pt's pain is much better.  She is tolerating pos.  She is stable for d/c.  Return if worse.  F/u with urology.   Isla Pence, MD 01/08/19 [phone]

## 2020-10-11 ENCOUNTER — Telehealth: Payer: Self-pay

## 2020-10-11 NOTE — Telephone Encounter (Signed)
NOTES ON FILE FROM  [facility] [phone] SENT REFERRAL TO SCHEDULING 

## 2020-11-01 ENCOUNTER — Telehealth: Payer: Self-pay

## 2020-11-01 NOTE — Telephone Encounter (Signed)
NOTES SCANNED TO REFERRAL 

## 2020-11-14 ENCOUNTER — Other Ambulatory Visit: Payer: Self-pay | Admitting: Orthopedic Surgery

## 2020-11-14 DIAGNOSIS — R531 Weakness: Secondary | ICD-10-CM

## 2020-11-14 DIAGNOSIS — S83209A Unspecified tear of unspecified meniscus, current injury, unspecified knee, initial encounter: Secondary | ICD-10-CM

## 2020-11-14 DIAGNOSIS — R52 Pain, unspecified: Secondary | ICD-10-CM

## 2020-12-01 ENCOUNTER — Ambulatory Visit
Admission: RE | Admit: 2020-12-01 | Discharge: 2020-12-01 | Disposition: A | Discharge status: Home / Self Care | Payer: Medicaid Other | Source: Ambulatory Visit | Attending: Orthopedic Surgery | Admitting: Orthopedic Surgery

## 2020-12-01 ENCOUNTER — Other Ambulatory Visit: Payer: Self-pay

## 2020-12-01 DIAGNOSIS — R52 Pain, unspecified: Secondary | ICD-10-CM

## 2020-12-01 DIAGNOSIS — R531 Weakness: Secondary | ICD-10-CM

## 2020-12-01 DIAGNOSIS — S83209A Unspecified tear of unspecified meniscus, current injury, unspecified knee, initial encounter: Secondary | ICD-10-CM
